# Patient Record
Sex: Female | Born: 1970 | Race: White | Hispanic: No | Smoking: Current every day smoker
Health system: Southern US, Community
[De-identification: ages and names within clinical notes are randomized; demographics above are authoritative.]

## PROBLEM LIST (undated history)

## (undated) DIAGNOSIS — F1010 Alcohol abuse, uncomplicated: Secondary | ICD-10-CM

## (undated) DIAGNOSIS — I639 Cerebral infarction, unspecified: Secondary | ICD-10-CM

## (undated) DIAGNOSIS — R569 Unspecified convulsions: Secondary | ICD-10-CM

## (undated) DIAGNOSIS — I10 Essential (primary) hypertension: Secondary | ICD-10-CM

## (undated) DIAGNOSIS — F419 Anxiety disorder, unspecified: Secondary | ICD-10-CM

## (undated) DIAGNOSIS — F32A Depression, unspecified: Secondary | ICD-10-CM

## (undated) DIAGNOSIS — K769 Liver disease, unspecified: Secondary | ICD-10-CM

## (undated) DIAGNOSIS — S42202A Unspecified fracture of upper end of left humerus, initial encounter for closed fracture: Secondary | ICD-10-CM

## (undated) DIAGNOSIS — F329 Major depressive disorder, single episode, unspecified: Secondary | ICD-10-CM

## (undated) HISTORY — PX: TONSILLECTOMY: SUR1361

## (undated) HISTORY — DX: Depression, unspecified: F32.A

## (undated) HISTORY — PX: COSMETIC SURGERY: SHX468

## (undated) HISTORY — PX: BREAST SURGERY: SHX581

## (undated) HISTORY — DX: Liver disease, unspecified: K76.9

## (undated) HISTORY — DX: Anxiety disorder, unspecified: F41.9

## (undated) HISTORY — DX: Unspecified convulsions: R56.9

## (undated) HISTORY — DX: Major depressive disorder, single episode, unspecified: F32.9

## (undated) HISTORY — PX: ABDOMINAL HYSTERECTOMY: SHX81

## (undated) HISTORY — DX: Cerebral infarction, unspecified: I63.9

---

## 1997-08-30 ENCOUNTER — Other Ambulatory Visit: Admission: RE | Admit: 1997-08-30 | Discharge: 1997-08-30 | Payer: Self-pay | Admitting: Obstetrics and Gynecology

## 1997-11-19 ENCOUNTER — Ambulatory Visit (HOSPITAL_COMMUNITY): Admission: RE | Admit: 1997-11-19 | Discharge: 1997-11-19 | Payer: Self-pay | Admitting: Obstetrics and Gynecology

## 1998-03-10 ENCOUNTER — Inpatient Hospital Stay (HOSPITAL_COMMUNITY): Admission: RE | Admit: 1998-03-10 | Discharge: 1998-03-11 | Payer: Self-pay | Admitting: Obstetrics and Gynecology

## 2000-03-31 ENCOUNTER — Other Ambulatory Visit: Admission: RE | Admit: 2000-03-31 | Discharge: 2000-03-31 | Payer: Self-pay | Admitting: Obstetrics and Gynecology

## 2000-06-13 ENCOUNTER — Ambulatory Visit (HOSPITAL_COMMUNITY): Admission: RE | Admit: 2000-06-13 | Discharge: 2000-06-13 | Payer: Self-pay | Admitting: Obstetrics and Gynecology

## 2001-04-20 ENCOUNTER — Other Ambulatory Visit: Admission: RE | Admit: 2001-04-20 | Discharge: 2001-04-20 | Payer: Self-pay | Admitting: Obstetrics and Gynecology

## 2002-12-28 ENCOUNTER — Other Ambulatory Visit: Admission: RE | Admit: 2002-12-28 | Discharge: 2002-12-28 | Payer: Self-pay | Admitting: Obstetrics and Gynecology

## 2005-02-22 ENCOUNTER — Other Ambulatory Visit: Payer: Self-pay

## 2005-02-22 ENCOUNTER — Observation Stay: Payer: Self-pay

## 2010-04-10 ENCOUNTER — Ambulatory Visit: Payer: Self-pay | Admitting: Family Medicine

## 2010-07-14 ENCOUNTER — Inpatient Hospital Stay (HOSPITAL_COMMUNITY)
Admission: RE | Admit: 2010-07-14 | Discharge: 2010-07-18 | DRG: 897 | Disposition: A | Discharge status: Home / Self Care | Payer: Managed Care, Other (non HMO) | Source: Ambulatory Visit | Attending: Psychiatry | Admitting: Psychiatry

## 2010-07-14 ENCOUNTER — Emergency Department (HOSPITAL_COMMUNITY)
Admission: EM | Admit: 2010-07-14 | Discharge: 2010-07-14 | Disposition: A | Discharge status: Other Acute Inpatient Hospital | Payer: Managed Care, Other (non HMO) | Attending: Emergency Medicine | Admitting: Emergency Medicine

## 2010-07-14 DIAGNOSIS — I10 Essential (primary) hypertension: Secondary | ICD-10-CM | POA: Insufficient documentation

## 2010-07-14 DIAGNOSIS — F3289 Other specified depressive episodes: Secondary | ICD-10-CM | POA: Insufficient documentation

## 2010-07-14 DIAGNOSIS — F1020 Alcohol dependence, uncomplicated: Principal | ICD-10-CM

## 2010-07-14 DIAGNOSIS — F1010 Alcohol abuse, uncomplicated: Secondary | ICD-10-CM | POA: Insufficient documentation

## 2010-07-14 DIAGNOSIS — F329 Major depressive disorder, single episode, unspecified: Secondary | ICD-10-CM | POA: Insufficient documentation

## 2010-07-14 DIAGNOSIS — Z6379 Other stressful life events affecting family and household: Secondary | ICD-10-CM

## 2010-07-14 LAB — COMPREHENSIVE METABOLIC PANEL
Alkaline Phosphatase: 149 U/L — ABNORMAL HIGH (ref 39–117)
BUN: 10 mg/dL (ref 6–23)
Chloride: 97 mEq/L (ref 96–112)
Glucose, Bld: 160 mg/dL — ABNORMAL HIGH (ref 70–99)
Potassium: 3.1 mEq/L — ABNORMAL LOW (ref 3.5–5.1)
Total Bilirubin: 1 mg/dL (ref 0.3–1.2)

## 2010-07-14 LAB — URINALYSIS, ROUTINE W REFLEX MICROSCOPIC
Protein, ur: NEGATIVE mg/dL
Urobilinogen, UA: 0.2 mg/dL (ref 0.0–1.0)

## 2010-07-14 LAB — DIFFERENTIAL
Basophils Relative: 0 % (ref 0–1)
Lymphocytes Relative: 25 % (ref 12–46)
Monocytes Absolute: 0.6 10*3/uL (ref 0.1–1.0)
Monocytes Relative: 12 % (ref 3–12)
Neutro Abs: 3.1 10*3/uL (ref 1.7–7.7)

## 2010-07-14 LAB — CBC
HCT: 38.7 % (ref 36.0–46.0)
Hemoglobin: 12.9 g/dL (ref 12.0–15.0)
MCH: 35.2 pg — ABNORMAL HIGH (ref 26.0–34.0)
MCHC: 33.3 g/dL (ref 30.0–36.0)

## 2010-07-14 LAB — RAPID URINE DRUG SCREEN, HOSP PERFORMED
Amphetamines: NOT DETECTED
Barbiturates: NOT DETECTED

## 2010-07-15 DIAGNOSIS — F1020 Alcohol dependence, uncomplicated: Secondary | ICD-10-CM

## 2010-07-15 LAB — COMPREHENSIVE METABOLIC PANEL
ALT: 44 U/L — ABNORMAL HIGH (ref 0–35)
AST: 104 U/L — ABNORMAL HIGH (ref 0–37)
Albumin: 3.2 g/dL — ABNORMAL LOW (ref 3.5–5.2)
CO2: 28 mEq/L (ref 19–32)
Calcium: 9.6 mg/dL (ref 8.4–10.5)
Chloride: 96 mEq/L (ref 96–112)
GFR calc Af Amer: >60 mL/min (ref >60)
GFR calc non Af Amer: >60 mL/min (ref >60)
Sodium: 134 mEq/L — ABNORMAL LOW (ref 135–145)

## 2010-07-18 NOTE — H&P (Signed)
Laura Heath              ACCOUNT NO.:  [id number]  MEDICAL RECORD NO.:  [id number]           PATIENT TYPE:  I  LOCATION:  0301                          FACILITY:  [facility]  PHYSICIAN:  Anselm Jungling, MD  DATE OF BIRTH:  01/14/1971  DATE OF ADMISSION:  07/14/2010 DATE OF DISCHARGE:                      PSYCHIATRIC ADMISSION ASSESSMENT   IDENTIFICATION:  A 40 year old female.  This is a voluntary admission.  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  She initially presented with an alcohol level of 297 mg/dL.  She reports drinking alcohol for the past 8 years with a gradual escalation in pattern.  It started out with a few shots, and through the years as she has increased it to a point now where for at least a year she has to drink shots all day long in order to keep from shaking.  Within the past week, she experienced a blackout at work and also fell at home and hit her head.  This week her husband left and took their 61 year old daughter with him.  Laura Heath would like to achieve sobriety and plans on pursuing group support and outpatient counseling to maintain sobriety.  She has no history of previous treatment or detox.  Denies suicidal thoughts.  PAST PSYCHIATRIC HISTORY:  Drinking alcohol for the past 8 years.  No previous history of psychotropics or alcohol or other substance treatment.  She denies a history of other substance abuse.  Currently she wakes up in the morning, tremulous, and has to drink to calm down. Has to drink shots periodically throughout the day and has hidden liquor at work.  She has to drink heavily at home at night and drinks to go to sleep at night.  She estimates that she is drinking at least a fifth of whiskey every day.  SOCIAL HISTORY:  Married Caucasian female with a 84 year old daughter. Denies a history of legal  problems.  Currently, having marital difficulties and parenting issues related to the alcohol.  FAMILY HISTORY:  Positive for grandfather with alcohol abuse.  MEDICAL HISTORY: 1. Primary care provider is [facility]. 2. Hospitalized times 1 for hypomagnesemia with blackout and     hypokalemia.  MEDICAL PROBLEMS:  Hypertension.  MEDICATIONS: 1. Benazepril 10 mg daily. 2. Atenolol 50 mg daily.  Transcriptionist under past medical history.  No chief.  DRUG ALLERGIES:  Percocet.  PHYSICAL EXAMINATION:  GENERAL:  Done in the emergency room.  This is a petite Caucasian female, tremulous but fully alert. ADMITTING VITAL SIGNS:  Temperature 98.2, pulse 64, respirations 18, blood pressure 128/80.  Pulse oximetry 100%.  CBC:  WBC 5.0, hemoglobin 12.9, hematocrit 38.7, platelets 99,000, MCV 105.7.  Electrolytes are remarkable for potassium of 3.1, BUN 10, creatinine 0.81.  Liver enzymes are elevated with an SGOT of 111, SGPT 49, alkaline phosphatase 149, total bilirubin 1.0.  Urine drug screen negative for all substances. Routine urinalysis reveals ketones 40.  MENTAL STATUS EXAM:  Fully alert female, good eye contact, tremulous and anxious in appearance.  No signs of delirium or  confusion, cooperative, wanting detox from the alcohol, very motivated to get her daughter back and keep her marriage intact.  Wants to pursue outpatient treatment. Candid about her alcohol use.  Cognitively intact.  Mild motor ataxia is noted, but her gait is fairly stable.  No nystagmus is noted.  No dangerous thoughts.  No suicidal thinking, concentration and judgment are intact.  DISCHARGE DIAGNOSES:  AXIS I:  Alcohol dependence. AXIS II:  Deferred. AXIS III:  Hypertension by history. AXIS IV:  Severe marital and parenting issues. AXIS V:  Current is 40, past year not known.  PLAN:  Plan is to voluntarily admit her with a goal of a safe detox in 5 days.  We are going to restart her home  medications and started her on a Librium detox protocol and explained the medications to her.  We are going to recheck a C-met, B12 level and will give her magnesium 200 mg b.i.d. x3 days.  Recheck a magnesium level tomorrow also and will replete her potassium with 40 mEq of potassium today.     Margaret A. Lorin Picket, N.P.   ______________________________ Anselm Jungling, MD    MAS/MEDQ  D:  07/15/2010  T:  07/15/2010  Job:  [id number]  Electronically Signed by Kari Baars N.P. on 07/16/2010 04:34:25 PM Electronically Signed by Geralyn Flash MD on 07/18/2010 11:29:58 AM

## 2010-07-25 NOTE — Discharge Summary (Signed)
  Laura Heath              ACCOUNT NO.:  [id number]  MEDICAL RECORD NO.:  [id number]           PATIENT TYPE:  I  LOCATION:  0302                          FACILITY:  [facility]  PHYSICIAN:  Anselm Jungling, MD  DATE OF BIRTH:  Jan 18, 1971  DATE OF ADMISSION:  07/14/2010 DATE OF DISCHARGE:  07/18/2010                              DISCHARGE SUMMARY   IDENTIFYING DATA/REASON FOR ADMISSION:  This was an inpatient psychiatric admission for Laura Heath, a 40 year old female who presented for treatment of alcohol dependence.  Please refer to the admission note for further details pertaining to the symptoms, circumstances and history that led to her hospitalization.  She was given an initial Axis I diagnosis of alcohol dependence.  MEDICAL AND LABORATORY:  The patient was medically and physically assessed by the psychiatric nurse practitioner.  She was in generally good health but was continued on her usual regimen of atenolol and benazepril.  There were no significant medical issues, otherwise, during her stay.  HOSPITAL COURSE:  The patient was admitted to the adult inpatient psychiatric service.  She presented as a well-nourished, normally- developed adult female who was pleasant and cooperative but sad.  Her thoughts and speech were normally organized.  She verbalized a strong desire for help.  She had presented to the emergency service with a blood alcohol level of 0.297.  She had been drinking heavily for 8 years.  This was her first ever inpatient admission and first ever course of detoxification for alcohol abuse.  Her detoxification was uneventful.  She participated in therapeutic groups and activities including those geared towards 12-step recovery. On the 5th hospital day, she agreed to the following aftercare plan.  AFTERCARE:  The patient was to follow up with Isla Pence, LCSW, to be arranged at the time of this dictation.  DISCHARGE MEDICATIONS: 1. Atenolol, 1  tablet daily. 2. Benazepril, 1 tablet daily.  DISCHARGE DIAGNOSES:  Axis I: Alcohol dependence, early remission. Axis II: Deferred. Axis III: Hypertension. Axis IV: Stressors severe. Axis V:  Global assessment of functioning on discharge 50.  The suicide risk assessment was completed at the time of discharge and there was felt to be minimal risk based upon it.     Anselm Jungling, MD     SPB/MEDQ  D:  07/24/2010  T:  07/24/2010  Job:  [id number]  Electronically Signed by Geralyn Flash MD on 07/25/2010 09:59:31 AM

## 2012-07-12 ENCOUNTER — Emergency Department: Payer: Self-pay | Admitting: Emergency Medicine

## 2012-07-27 ENCOUNTER — Emergency Department: Payer: Self-pay | Admitting: Emergency Medicine

## 2012-11-19 ENCOUNTER — Emergency Department: Payer: Self-pay

## 2012-11-19 LAB — COMPREHENSIVE METABOLIC PANEL
Anion Gap: 10 (ref 7–16)
BUN: 4 mg/dL — ABNORMAL LOW (ref 7–18)
Bilirubin,Total: 4.3 mg/dL — ABNORMAL HIGH (ref 0.2–1.0)
Calcium, Total: 9.1 mg/dL (ref 8.5–10.1)
Co2: 28 mmol/L (ref 21–32)
Creatinine: 0.72 mg/dL (ref 0.60–1.30)
EGFR (Non-African Amer.): >60
Glucose: 104 mg/dL — ABNORMAL HIGH (ref 65–99)
Osmolality: 267 (ref 275–301)
SGOT(AST): 250 U/L — ABNORMAL HIGH (ref 15–37)
SGPT (ALT): 73 U/L (ref 12–78)

## 2012-11-19 LAB — URINALYSIS, COMPLETE
Blood: NEGATIVE
Ketone: NEGATIVE
Ph: 8 (ref 4.5–8.0)
RBC,UR: 5 /HPF (ref 0–5)
Specific Gravity: 1.017 (ref 1.003–1.030)
Squamous Epithelial: 1

## 2012-11-19 LAB — DRUG SCREEN, URINE
Barbiturates, Ur Screen: NEGATIVE (ref ?–200)
Cocaine Metabolite,Ur ~~LOC~~: NEGATIVE (ref ?–300)
MDMA (Ecstasy)Ur Screen: NEGATIVE (ref ?–500)
Methadone, Ur Screen: NEGATIVE (ref ?–300)
Phencyclidine (PCP) Ur S: NEGATIVE (ref ?–25)
Tricyclic, Ur Screen: NEGATIVE (ref ?–1000)

## 2012-11-19 LAB — CBC
HGB: 12.7 g/dL (ref 12.0–16.0)
MCHC: 34 g/dL (ref 32.0–36.0)
Platelet: 156 10*3/uL (ref 150–440)
RBC: 3.11 10*6/uL — ABNORMAL LOW (ref 3.80–5.20)
RDW: 15.9 % — ABNORMAL HIGH (ref 11.5–14.5)

## 2012-11-19 LAB — TSH: Thyroid Stimulating Horm: 1.01 u[IU]/mL

## 2012-11-19 LAB — ETHANOL: Ethanol: <3 mg/dL

## 2013-10-23 ENCOUNTER — Emergency Department: Payer: Self-pay | Admitting: Emergency Medicine

## 2013-10-23 LAB — ETHANOL
ETHANOL %: 0.28 % — AB (ref 0.000–0.080)
Ethanol: 280 mg/dL

## 2014-08-19 ENCOUNTER — Other Ambulatory Visit: Payer: Self-pay | Admitting: Physician Assistant

## 2014-08-19 DIAGNOSIS — Z139 Encounter for screening, unspecified: Secondary | ICD-10-CM

## 2014-09-07 ENCOUNTER — Ambulatory Visit: Payer: Managed Care, Other (non HMO) | Admitting: *Deleted

## 2014-09-28 DIAGNOSIS — S42202A Unspecified fracture of upper end of left humerus, initial encounter for closed fracture: Secondary | ICD-10-CM

## 2014-09-28 HISTORY — DX: Unspecified fracture of upper end of left humerus, initial encounter for closed fracture: S42.202A

## 2014-10-01 ENCOUNTER — Emergency Department: Payer: Managed Care, Other (non HMO)

## 2014-10-01 ENCOUNTER — Emergency Department: Payer: Self-pay

## 2014-10-01 ENCOUNTER — Other Ambulatory Visit: Payer: Self-pay

## 2014-10-01 ENCOUNTER — Emergency Department
Admission: EM | Admit: 2014-10-01 | Discharge: 2014-10-01 | Disposition: A | Discharge status: Other Acute Inpatient Hospital | Payer: Self-pay | Attending: Emergency Medicine | Admitting: Emergency Medicine

## 2014-10-01 DIAGNOSIS — Z720 Tobacco use: Secondary | ICD-10-CM | POA: Insufficient documentation

## 2014-10-01 DIAGNOSIS — W1839XA Other fall on same level, initial encounter: Secondary | ICD-10-CM | POA: Insufficient documentation

## 2014-10-01 DIAGNOSIS — S42202D Unspecified fracture of upper end of left humerus, subsequent encounter for fracture with routine healing: Secondary | ICD-10-CM

## 2014-10-01 DIAGNOSIS — S40012A Contusion of left shoulder, initial encounter: Secondary | ICD-10-CM | POA: Insufficient documentation

## 2014-10-01 DIAGNOSIS — F10931 Alcohol use, unspecified with withdrawal delirium: Secondary | ICD-10-CM

## 2014-10-01 DIAGNOSIS — Y9289 Other specified places as the place of occurrence of the external cause: Secondary | ICD-10-CM | POA: Insufficient documentation

## 2014-10-01 DIAGNOSIS — S065X9A Traumatic subdural hemorrhage with loss of consciousness of unspecified duration, initial encounter: Secondary | ICD-10-CM

## 2014-10-01 DIAGNOSIS — S42292D Other displaced fracture of upper end of left humerus, subsequent encounter for fracture with routine healing: Secondary | ICD-10-CM | POA: Insufficient documentation

## 2014-10-01 DIAGNOSIS — I10 Essential (primary) hypertension: Secondary | ICD-10-CM | POA: Insufficient documentation

## 2014-10-01 DIAGNOSIS — S065XAA Traumatic subdural hemorrhage with loss of consciousness status unknown, initial encounter: Secondary | ICD-10-CM

## 2014-10-01 DIAGNOSIS — F10231 Alcohol dependence with withdrawal delirium: Secondary | ICD-10-CM | POA: Insufficient documentation

## 2014-10-01 DIAGNOSIS — F911 Conduct disorder, childhood-onset type: Secondary | ICD-10-CM | POA: Insufficient documentation

## 2014-10-01 DIAGNOSIS — Y998 Other external cause status: Secondary | ICD-10-CM | POA: Insufficient documentation

## 2014-10-01 DIAGNOSIS — I9589 Other hypotension: Secondary | ICD-10-CM | POA: Insufficient documentation

## 2014-10-01 DIAGNOSIS — S065X0A Traumatic subdural hemorrhage without loss of consciousness, initial encounter: Secondary | ICD-10-CM | POA: Insufficient documentation

## 2014-10-01 DIAGNOSIS — Y9389 Activity, other specified: Secondary | ICD-10-CM | POA: Insufficient documentation

## 2014-10-01 HISTORY — DX: Essential (primary) hypertension: I10

## 2014-10-01 HISTORY — DX: Alcohol abuse, uncomplicated: F10.10

## 2014-10-01 HISTORY — DX: Unspecified fracture of upper end of left humerus, initial encounter for closed fracture: S42.202A

## 2014-10-01 LAB — COMPREHENSIVE METABOLIC PANEL
ALK PHOS: 219 U/L — AB (ref 38–126)
ALT: 22 U/L (ref 14–54)
AST: 83 U/L — AB (ref 15–41)
Albumin: 2.5 g/dL — ABNORMAL LOW (ref 3.5–5.0)
Anion gap: 11 (ref 5–15)
BUN: 15 mg/dL (ref 6–20)
CHLORIDE: 99 mmol/L — AB (ref 101–111)
CO2: 24 mmol/L (ref 22–32)
Calcium: 7.7 mg/dL — ABNORMAL LOW (ref 8.9–10.3)
Creatinine, Ser: 0.76 mg/dL (ref 0.44–1.00)
GFR calc non Af Amer: >60 mL/min (ref >60)
Glucose, Bld: 103 mg/dL — ABNORMAL HIGH (ref 65–99)
POTASSIUM: 3.2 mmol/L — AB (ref 3.5–5.1)
Sodium: 134 mmol/L — ABNORMAL LOW (ref 135–145)
Total Bilirubin: 3.3 mg/dL — ABNORMAL HIGH (ref 0.3–1.2)
Total Protein: 6.4 g/dL — ABNORMAL LOW (ref 6.5–8.1)

## 2014-10-01 LAB — CBC WITH DIFFERENTIAL/PLATELET
Basophils Absolute: 0 10*3/uL (ref 0–0.1)
Basophils Relative: 0 %
Eosinophils Absolute: 0 10*3/uL (ref 0–0.7)
Eosinophils Relative: 0 %
HCT: 23.7 % — ABNORMAL LOW (ref 35.0–47.0)
HEMOGLOBIN: 8 g/dL — AB (ref 12.0–16.0)
LYMPHS ABS: 0.6 10*3/uL — AB (ref 1.0–3.6)
Lymphocytes Relative: 6 %
MCH: 37.1 pg — AB (ref 26.0–34.0)
MCHC: 33.7 g/dL (ref 32.0–36.0)
MCV: 109.9 fL — ABNORMAL HIGH (ref 80.0–100.0)
MONO ABS: 1.3 10*3/uL — AB (ref 0.2–0.9)
Monocytes Relative: 14 %
NEUTROS ABS: 7.1 10*3/uL — AB (ref 1.4–6.5)
Platelets: 63 10*3/uL — ABNORMAL LOW (ref 150–440)
RBC: 2.16 MIL/uL — ABNORMAL LOW (ref 3.80–5.20)
RDW: 14 % (ref 11.5–14.5)
WBC: 9 10*3/uL (ref 3.6–11.0)

## 2014-10-01 LAB — URINE DRUG SCREEN, QUALITATIVE (ARMC ONLY)
Amphetamines, Ur Screen: NOT DETECTED
BENZODIAZEPINE, UR SCRN: NOT DETECTED
Barbiturates, Ur Screen: NOT DETECTED
CANNABINOID 50 NG, UR ~~LOC~~: NOT DETECTED
Cocaine Metabolite,Ur ~~LOC~~: NOT DETECTED
MDMA (ECSTASY) UR SCREEN: NOT DETECTED
METHADONE SCREEN, URINE: NOT DETECTED
Opiate, Ur Screen: NOT DETECTED
Phencyclidine (PCP) Ur S: NOT DETECTED
Tricyclic, Ur Screen: POSITIVE — AB

## 2014-10-01 LAB — URINALYSIS COMPLETE WITH MICROSCOPIC (ARMC ONLY)
BILIRUBIN URINE: NEGATIVE
Glucose, UA: NEGATIVE mg/dL
HGB URINE DIPSTICK: NEGATIVE
Nitrite: POSITIVE — AB
Protein, ur: NEGATIVE mg/dL
Specific Gravity, Urine: 1.014 (ref 1.005–1.030)
pH: 6 (ref 5.0–8.0)

## 2014-10-01 LAB — OSMOLALITY: Osmolality: 278 mOsm/kg (ref 275–295)

## 2014-10-01 LAB — MAGNESIUM: Magnesium: 1.6 mg/dL — ABNORMAL LOW (ref 1.7–2.4)

## 2014-10-01 LAB — ETHANOL

## 2014-10-01 LAB — LIPASE, BLOOD: Lipase: 28 U/L (ref 22–51)

## 2014-10-01 MED ORDER — THIAMINE HCL 100 MG/ML IJ SOLN
400.0000 mg | INTRAMUSCULAR | Status: AC
  Administered 2014-10-01: 400 mg via INTRAVENOUS
  Filled 2014-10-01: qty 4

## 2014-10-01 MED ORDER — LORAZEPAM 2 MG PO TABS: 0.0000 mg | ORAL_TABLET | Freq: Four times a day (QID) | ORAL | Status: DC

## 2014-10-01 MED ORDER — LORAZEPAM 2 MG/ML IJ SOLN: 0.0000 mg | Freq: Two times a day (BID) | INTRAMUSCULAR | Status: DC

## 2014-10-01 MED ORDER — THIAMINE HCL 100 MG/ML IJ SOLN
INTRAMUSCULAR | Status: AC
  Filled 2014-10-01: qty 4

## 2014-10-01 MED ORDER — THIAMINE HCL 100 MG/ML IJ SOLN
100.0000 mg | Freq: Every day | INTRAMUSCULAR | Status: DC
  Administered 2014-10-01: 100 mg via INTRAVENOUS

## 2014-10-01 MED ORDER — LORAZEPAM 2 MG/ML IJ SOLN
INTRAMUSCULAR | Status: AC
  Administered 2014-10-01: 2 mg via INTRAVENOUS
  Filled 2014-10-01: qty 1

## 2014-10-01 MED ORDER — LORAZEPAM 2 MG PO TABS: 0.0000 mg | ORAL_TABLET | Freq: Two times a day (BID) | ORAL | Status: DC

## 2014-10-01 MED ORDER — THIAMINE HCL 100 MG/ML IJ SOLN
INTRAVENOUS | Status: AC
  Administered 2014-10-01: 17:00:00 via INTRAVENOUS
  Filled 2014-10-01: qty 1000

## 2014-10-01 MED ORDER — LORAZEPAM 2 MG/ML IJ SOLN
0.0000 mg | Freq: Four times a day (QID) | INTRAMUSCULAR | Status: DC
  Administered 2014-10-01: 2 mg via INTRAVENOUS

## 2014-10-01 MED ORDER — THIAMINE HCL 100 MG/ML IJ SOLN
INTRAMUSCULAR | Status: AC
  Administered 2014-10-01: 100 mg via INTRAVENOUS
  Filled 2014-10-01: qty 2

## 2014-10-01 MED ORDER — VITAMIN B-1 100 MG PO TABS: 100.0000 mg | ORAL_TABLET | Freq: Every day | ORAL | Status: DC
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
## 2014-10-01 NOTE — ED Notes (Signed)
Pt reports to ED b/c of fall. Pt sts she fell last night and several times this morning.  Pt denies LOC, n/v and SOB. Pt sts she hit back of head. Pt sts she has taken ibuprofen and tramadol.

## 2014-10-02 DIAGNOSIS — S065X9A Traumatic subdural hemorrhage with loss of consciousness of unspecified duration, initial encounter: Secondary | ICD-10-CM | POA: Insufficient documentation

## 2014-10-02 DIAGNOSIS — S065XAA Traumatic subdural hemorrhage with loss of consciousness status unknown, initial encounter: Secondary | ICD-10-CM | POA: Insufficient documentation

## 2014-10-03 DIAGNOSIS — D696 Thrombocytopenia, unspecified: Secondary | ICD-10-CM | POA: Insufficient documentation

## 2014-10-03 DIAGNOSIS — K7030 Alcoholic cirrhosis of liver without ascites: Secondary | ICD-10-CM | POA: Insufficient documentation

## 2014-10-03 DIAGNOSIS — S42209A Unspecified fracture of upper end of unspecified humerus, initial encounter for closed fracture: Secondary | ICD-10-CM | POA: Insufficient documentation

## 2014-10-03 DIAGNOSIS — D539 Nutritional anemia, unspecified: Secondary | ICD-10-CM | POA: Insufficient documentation

## 2015-01-25 ENCOUNTER — Ambulatory Visit: Payer: Self-pay | Admitting: Internal Medicine

## 2015-02-01 ENCOUNTER — Ambulatory Visit: Payer: Self-pay | Admitting: Internal Medicine

## 2015-02-01 LAB — LIPID PANEL
Cholesterol: 304 mg/dL — AB (ref 0–200)
HDL: 114 mg/dL — AB (ref 35–70)
LDL CALC: 170 mg/dL
TRIGLYCERIDES: 102 mg/dL (ref 40–160)

## 2015-02-01 LAB — TSH: TSH: 0.49 u[IU]/mL (ref 0.41–5.90)

## 2015-02-01 LAB — HEPATIC FUNCTION PANEL: BILIRUBIN, TOTAL: 1.6 mg/dL

## 2015-02-08 ENCOUNTER — Ambulatory Visit: Payer: Self-pay | Admitting: Internal Medicine

## 2015-02-15 ENCOUNTER — Ambulatory Visit: Payer: Self-pay | Admitting: Internal Medicine

## 2015-02-15 ENCOUNTER — Ambulatory Visit: Payer: Self-pay | Admitting: Ophthalmology

## 2015-03-15 ENCOUNTER — Other Ambulatory Visit: Payer: Self-pay

## 2015-03-22 ENCOUNTER — Ambulatory Visit: Payer: Self-pay | Admitting: Internal Medicine

## 2015-03-29 ENCOUNTER — Ambulatory Visit: Payer: Self-pay | Admitting: Internal Medicine

## 2015-05-10 ENCOUNTER — Other Ambulatory Visit: Payer: Self-pay

## 2015-05-11 ENCOUNTER — Ambulatory Visit: Payer: Self-pay

## 2015-05-16 ENCOUNTER — Encounter: Payer: Self-pay | Admitting: *Deleted

## 2015-05-16 ENCOUNTER — Emergency Department: Payer: Self-pay

## 2015-05-16 ENCOUNTER — Emergency Department: Payer: Managed Care, Other (non HMO)

## 2015-05-16 ENCOUNTER — Emergency Department
Admission: EM | Admit: 2015-05-16 | Discharge: 2015-05-17 | Disposition: A | Discharge status: Other Acute Inpatient Hospital | Payer: Self-pay | Attending: Emergency Medicine | Admitting: Emergency Medicine

## 2015-05-16 DIAGNOSIS — F1010 Alcohol abuse, uncomplicated: Secondary | ICD-10-CM

## 2015-05-16 DIAGNOSIS — I10 Essential (primary) hypertension: Secondary | ICD-10-CM | POA: Insufficient documentation

## 2015-05-16 DIAGNOSIS — S5012XA Contusion of left forearm, initial encounter: Secondary | ICD-10-CM | POA: Insufficient documentation

## 2015-05-16 DIAGNOSIS — F10239 Alcohol dependence with withdrawal, unspecified: Secondary | ICD-10-CM

## 2015-05-16 DIAGNOSIS — S60222A Contusion of left hand, initial encounter: Secondary | ICD-10-CM | POA: Insufficient documentation

## 2015-05-16 DIAGNOSIS — F10939 Alcohol use, unspecified with withdrawal, unspecified: Secondary | ICD-10-CM

## 2015-05-16 DIAGNOSIS — R296 Repeated falls: Secondary | ICD-10-CM | POA: Insufficient documentation

## 2015-05-16 DIAGNOSIS — R4689 Other symptoms and signs involving appearance and behavior: Secondary | ICD-10-CM

## 2015-05-16 DIAGNOSIS — F17210 Nicotine dependence, cigarettes, uncomplicated: Secondary | ICD-10-CM | POA: Insufficient documentation

## 2015-05-16 DIAGNOSIS — F911 Conduct disorder, childhood-onset type: Secondary | ICD-10-CM | POA: Insufficient documentation

## 2015-05-16 DIAGNOSIS — Y998 Other external cause status: Secondary | ICD-10-CM | POA: Insufficient documentation

## 2015-05-16 DIAGNOSIS — F1310 Sedative, hypnotic or anxiolytic abuse, uncomplicated: Secondary | ICD-10-CM | POA: Insufficient documentation

## 2015-05-16 DIAGNOSIS — Y9389 Activity, other specified: Secondary | ICD-10-CM | POA: Insufficient documentation

## 2015-05-16 DIAGNOSIS — Y9289 Other specified places as the place of occurrence of the external cause: Secondary | ICD-10-CM | POA: Insufficient documentation

## 2015-05-16 DIAGNOSIS — F1994 Other psychoactive substance use, unspecified with psychoactive substance-induced mood disorder: Secondary | ICD-10-CM

## 2015-05-16 DIAGNOSIS — Z23 Encounter for immunization: Secondary | ICD-10-CM | POA: Insufficient documentation

## 2015-05-16 HISTORY — DX: Cerebral infarction, unspecified: I63.9

## 2015-05-16 LAB — CBC WITH DIFFERENTIAL/PLATELET
BASOS ABS: 0.1 10*3/uL (ref 0–0.1)
BASOS PCT: 1 %
Eosinophils Absolute: 0.2 10*3/uL (ref 0–0.7)
Eosinophils Relative: 2 %
HCT: 45.9 % (ref 35.0–47.0)
HEMOGLOBIN: 15.3 g/dL (ref 12.0–16.0)
LYMPHS ABS: 2.1 10*3/uL (ref 1.0–3.6)
Lymphocytes Relative: 25 %
MCH: 36.4 pg — ABNORMAL HIGH (ref 26.0–34.0)
MCHC: 33.3 g/dL (ref 32.0–36.0)
MCV: 109.2 fL — ABNORMAL HIGH (ref 80.0–100.0)
Monocytes Absolute: 0.6 10*3/uL (ref 0.2–0.9)
Monocytes Relative: 7 %
Neutro Abs: 5.4 10*3/uL (ref 1.4–6.5)
Neutrophils Relative %: 65 %
Platelets: 194 10*3/uL (ref 150–440)
RBC: 4.21 MIL/uL (ref 3.80–5.20)
RDW: 14.5 % (ref 11.5–14.5)
WBC: 8.3 10*3/uL (ref 3.6–11.0)

## 2015-05-16 LAB — URINE DRUG SCREEN, QUALITATIVE (ARMC ONLY)
Amphetamines, Ur Screen: NOT DETECTED
BARBITURATES, UR SCREEN: NOT DETECTED
BENZODIAZEPINE, UR SCRN: NOT DETECTED
CANNABINOID 50 NG, UR ~~LOC~~: NOT DETECTED
Cocaine Metabolite,Ur ~~LOC~~: NOT DETECTED
MDMA (Ecstasy)Ur Screen: NOT DETECTED
Methadone Scn, Ur: NOT DETECTED
Opiate, Ur Screen: NOT DETECTED
PHENCYCLIDINE (PCP) UR S: NOT DETECTED
Tricyclic, Ur Screen: POSITIVE — AB

## 2015-05-16 LAB — COMPREHENSIVE METABOLIC PANEL
ALT: 28 U/L (ref 14–54)
AST: 144 U/L — ABNORMAL HIGH (ref 15–41)
Albumin: 3.7 g/dL (ref 3.5–5.0)
Alkaline Phosphatase: 354 U/L — ABNORMAL HIGH (ref 38–126)
Anion gap: 16 — ABNORMAL HIGH (ref 5–15)
BILIRUBIN TOTAL: 2.2 mg/dL — AB (ref 0.3–1.2)
BUN: 6 mg/dL (ref 6–20)
CO2: 22 mmol/L (ref 22–32)
CREATININE: 0.54 mg/dL (ref 0.44–1.00)
Calcium: 9.6 mg/dL (ref 8.9–10.3)
Chloride: 102 mmol/L (ref 101–111)
GFR calc Af Amer: >60 mL/min (ref >60)
Glucose, Bld: 109 mg/dL — ABNORMAL HIGH (ref 65–99)
Potassium: 4.4 mmol/L (ref 3.5–5.1)
Sodium: 140 mmol/L (ref 135–145)
Total Protein: 9.2 g/dL — ABNORMAL HIGH (ref 6.5–8.1)

## 2015-05-16 LAB — SALICYLATE LEVEL

## 2015-05-16 LAB — ETHANOL: ALCOHOL ETHYL (B): 336 mg/dL — AB (ref <5)

## 2015-05-16 LAB — ACETAMINOPHEN LEVEL

## 2015-05-16 MED ORDER — LORAZEPAM 2 MG PO TABS: 0.0000 mg | ORAL_TABLET | Freq: Four times a day (QID) | ORAL | Status: DC

## 2015-05-16 MED ORDER — LORAZEPAM 2 MG PO TABS: 0.0000 mg | ORAL_TABLET | Freq: Two times a day (BID) | ORAL | Status: DC

## 2015-05-16 MED ORDER — TETANUS-DIPHTH-ACELL PERTUSSIS 5-2.5-18.5 LF-MCG/0.5 IM SUSP
0.5000 mL | Freq: Once | INTRAMUSCULAR | Status: AC
  Administered 2015-05-16: 0.5 mL via INTRAMUSCULAR
  Filled 2015-05-16: qty 0.5

## 2015-05-16 MED ORDER — FOLIC ACID 1 MG PO TABS: 1.0000 mg | ORAL_TABLET | Freq: Once | ORAL | Status: DC

## 2015-05-16 MED ORDER — VITAMIN B-1 100 MG PO TABS
100.0000 mg | ORAL_TABLET | Freq: Every day | ORAL | Status: DC
  Administered 2015-05-17: 100 mg via ORAL
  Filled 2015-05-16: qty 1

## 2015-05-16 NOTE — Progress Notes (Deleted)
Per Dr Clapacs pt is to be admitted to [facility] inpatient psychiatric unit.  05/16/2015 Nicole Kennedy Brines, MS, NCC, LPCA Therapeutic Triage Specialist 
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
## 2015-05-16 NOTE — ED Notes (Signed)
Report received from Nellie, RN. Pt. Alert and oriented in no distress denies SI, HI, AVH and pain.   Pt. Instructed to come to me with problems or concerns.Will continue to monitor for safety via security cameras and Q 15 minute checks.  

## 2015-05-16 NOTE — ED Notes (Signed)
Pt. To ED-BHU  from ED ambulatory without difficulty, to room #1 . Report from RN. Pt. Is alert and oriented, warm and dry in no distress. Pt. Denies SI, HI, and AVH. Pt. Calm and cooperative. Pt. Made aware of security cameras and Q15 minute rounds. Pt. Encouraged to let Nursing staff know of any concerns or needs.

## 2015-05-16 NOTE — ED Notes (Signed)
Pt. Noted in room lying in bed watching the tv., . No complaints or concerns voiced. No distress or abnormal behavior noted. Will continue to monitor with security cameras. Q 15 minute rounds continue. 
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
## 2015-05-17 ENCOUNTER — Ambulatory Visit: Payer: Self-pay | Admitting: Internal Medicine
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
## 2015-05-17 NOTE — ED Notes (Signed)
Pt. Noted in room resting quietly;. No complaints or concerns voiced. No distress or abnormal behavior noted. Will continue to monitor with security cameras. Q 15 minute rounds continue. 

## 2015-05-17 NOTE — ED Notes (Signed)
Patient re-assesed by Dr.Clapacs IVC rescinded patient to be D/C

## 2015-05-17 NOTE — ED Notes (Signed)
Lunch served

## 2015-05-17 NOTE — ED Notes (Signed)
Pt. Noted in room. No complaints or concerns voiced. No distress or abnormal behavior noted. Will continue to monitor with security cameras. Q 15 minute rounds continue. 

## 2015-05-17 NOTE — ED Notes (Signed)
Pt. Noted in room. No complaints or concerns voiced. No distress or abnormal behavior noted. Will continue to monitor with security cameras. Q 15 minute rounds continue.,visit went well with mom , mom just felt that pt should stay ?

## 2015-05-17 NOTE — ED Notes (Signed)
Pt mother in lobby wishing to talk to staff and pt wishes us not to, pt and mother informed they nay visit at visiting  time

## 2015-05-17 NOTE — ED Notes (Signed)
Dr Clapacs here 

## 2015-05-17 NOTE — ED Notes (Signed)
pts mother here to visit with staff in attendance

## 2015-05-17 NOTE — ED Notes (Signed)
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
## 2015-05-17 NOTE — ED Notes (Signed)
Pt. Noted in room. No complaints or concerns voiced. No distress or abnormal behavior noted. Will continue to monitor with security cameras. Q 15 minute rounds continue.,explain to pt the disch process ,she is agreeable with plan

## 2015-05-17 NOTE — ED Provider Notes (Signed)
-----------------------------------------   4:19 PM on 05/17/2015 -----------------------------------------  Case discussed with Dr. Delaney Meigslaypacs who has evaluated the patient and lifted the patient's involuntary commitment. He recommends discharge at this time with outpatient resources. Vital signs stable.  Gayla DossEryka A Nelta Caudill, MD 05/17/15 [phone]

## 2015-05-17 NOTE — ED Notes (Signed)
Pt dc to home with mother.

## 2015-05-24 ENCOUNTER — Ambulatory Visit: Payer: Self-pay | Admitting: Internal Medicine

## 2015-05-31 ENCOUNTER — Ambulatory Visit: Payer: Self-pay | Admitting: Internal Medicine

## 2015-06-21 ENCOUNTER — Other Ambulatory Visit: Payer: Self-pay

## 2015-06-21 LAB — BASIC METABOLIC PANEL
Creatinine: 0.6 mg/dL (ref 0.5–1.1)
Glucose: 102 mg/dL

## 2015-06-28 ENCOUNTER — Ambulatory Visit: Payer: Self-pay | Admitting: Internal Medicine

## 2015-09-26 ENCOUNTER — Other Ambulatory Visit: Payer: Self-pay

## 2015-09-26 DIAGNOSIS — I10 Essential (primary) hypertension: Secondary | ICD-10-CM

## 2015-09-26 DIAGNOSIS — F10239 Alcohol dependence with withdrawal, unspecified: Secondary | ICD-10-CM

## 2015-09-26 DIAGNOSIS — F1010 Alcohol abuse, uncomplicated: Secondary | ICD-10-CM

## 2015-09-26 DIAGNOSIS — F10939 Alcohol use, unspecified with withdrawal, unspecified: Secondary | ICD-10-CM

## 2015-09-26 DIAGNOSIS — Z139 Encounter for screening, unspecified: Secondary | ICD-10-CM

## 2015-09-27 LAB — COMPREHENSIVE METABOLIC PANEL
ALBUMIN: 4.1 g/dL (ref 3.5–5.5)
ALT: 17 IU/L (ref 0–32)
AST: 85 IU/L — ABNORMAL HIGH (ref 0–40)
Albumin/Globulin Ratio: 1.1 — ABNORMAL LOW (ref 1.2–2.2)
Alkaline Phosphatase: 323 IU/L — ABNORMAL HIGH (ref 39–117)
BUN / CREAT RATIO: 22 (ref 9–23)
BUN: 14 mg/dL (ref 6–24)
Bilirubin Total: 1 mg/dL (ref 0.0–1.2)
CALCIUM: 9.3 mg/dL (ref 8.7–10.2)
CO2: 23 mmol/L (ref 18–29)
CREATININE: 0.65 mg/dL (ref 0.57–1.00)
Chloride: 98 mmol/L (ref 96–106)
GFR, EST AFRICAN AMERICAN: 122 mL/min/{1.73_m2} (ref >59)
GFR, EST NON AFRICAN AMERICAN: 106 mL/min/{1.73_m2} (ref >59)
GLOBULIN, TOTAL: 3.8 g/dL (ref 1.5–4.5)
GLUCOSE: 115 mg/dL — AB (ref 65–99)
Potassium: 3.9 mmol/L (ref 3.5–5.2)
SODIUM: 142 mmol/L (ref 134–144)
TOTAL PROTEIN: 7.9 g/dL (ref 6.0–8.5)

## 2015-09-27 LAB — CBC WITH DIFFERENTIAL/PLATELET
BASOS ABS: 0 10*3/uL (ref 0.0–0.2)
Basos: 1 %
EOS (ABSOLUTE): 0.1 10*3/uL (ref 0.0–0.4)
Eos: 2 %
Hematocrit: 33.6 % — ABNORMAL LOW (ref 34.0–46.6)
Hemoglobin: 11.4 g/dL (ref 11.1–15.9)
Immature Grans (Abs): 0 10*3/uL (ref 0.0–0.1)
Immature Granulocytes: 0 %
LYMPHS ABS: 1.1 10*3/uL (ref 0.7–3.1)
Lymphs: 19 %
MCH: 35.3 pg — ABNORMAL HIGH (ref 26.6–33.0)
MCHC: 33.9 g/dL (ref 31.5–35.7)
MCV: 104 fL — ABNORMAL HIGH (ref 79–97)
MONOCYTES: 15 %
Monocytes Absolute: 0.8 10*3/uL (ref 0.1–0.9)
Neutrophils Absolute: 3.6 10*3/uL (ref 1.4–7.0)
Neutrophils: 63 %
PLATELETS: 116 10*3/uL — AB (ref 150–379)
RBC: 3.23 x10E6/uL — AB (ref 3.77–5.28)
RDW: 13.6 % (ref 12.3–15.4)
WBC: 5.7 10*3/uL (ref 3.4–10.8)

## 2015-09-27 LAB — GAMMA GT: GGT: 1773 IU/L (ref 0–60)

## 2015-09-27 LAB — SPECIMEN STATUS REPORT

## 2015-09-27 LAB — AMMONIA

## 2015-09-27 LAB — FOLATE: Folate: 7.1 ng/mL (ref >3.0)

## 2015-10-04 ENCOUNTER — Ambulatory Visit: Payer: Self-pay | Admitting: Internal Medicine

## 2015-10-04 ENCOUNTER — Encounter: Payer: Self-pay | Admitting: Internal Medicine

## 2015-10-04 VITALS — BP 114/86 | HR 106 | Temp 98.3°F | Wt 112.0 lb

## 2015-10-04 DIAGNOSIS — D6489 Other specified anemias: Secondary | ICD-10-CM

## 2015-10-04 DIAGNOSIS — M25512 Pain in left shoulder: Secondary | ICD-10-CM

## 2015-10-04 MED ORDER — CYCLOBENZAPRINE HCL 10 MG PO TABS: 10.0000 mg | ORAL_TABLET | Freq: Three times a day (TID) | ORAL | Status: DC

## 2015-10-04 NOTE — Progress Notes (Signed)
   Subjective:    Patient ID: Laura FortsDeanna M Tilghman, female    DOB: 01/30/1971, 45 y.o.   MRN: [id number]  HPI Patient Active Problem List   Diagnosis Date Noted  . Alcohol abuse 05/16/2015  . Substance induced mood disorder ([facility]) 05/16/2015  . Alcohol withdrawal ([facility]) 05/16/2015  . Hypertension 05/16/2015   Patient presents with shoulder and back pain. Patient has had injections in hip and shoulder for pain. She is not doing physical therapy at this time.  Patient takes amitriptyline for sleeping.  Review of Systems Patient's cirrhosis blood tests are stable.  She is mildly anemic, no edema. Last GGT shows that she is still consuming alcohol.      Objective:   Physical Exam  Constitutional: She is oriented to person, place, and time.  Cardiovascular: Normal rate, regular rhythm and normal heart sounds.   Pulmonary/Chest: Effort normal and breath sounds normal.  Neurological: She is alert and oriented to person, place, and time.    BP 114/86 mmHg  Pulse 106  Temp(Src) 98.3 F (36.8 C)  Wt 112 lb (50.803 kg)    Medication List       This list is accurate as of: 10/04/15 11:07 AM.  Always use your most recent med list.               amitriptyline 10 MG tablet  Commonly known as:  ELAVIL  Take 10 mg by mouth 2 (two) times daily.     ascorbic acid 250 MG tablet  Commonly known as:  VITAMIN C  Take 250 mg by mouth 2 (two) times daily.     atenolol 50 MG tablet  Commonly known as:  TENORMIN  Take 50 mg by mouth daily.     benazepril 10 MG tablet  Commonly known as:  LOTENSIN  Take 10 mg by mouth daily.     cyclobenzaprine 10 MG tablet  Commonly known as:  FLEXERIL  Take 1 tablet (10 mg total) by mouth 3 (three) times daily.     docusate sodium 100 MG capsule  Commonly known as:  COLACE  Take 100 mg by mouth 2 (two) times daily. Reported on 10/04/2015     folic acid 1 MG tablet  Commonly known as:  FOLVITE  Take 1 mg by mouth daily.     lactulose 10 GM/15ML  solution  Commonly known as:  CHRONULAC  Take 20 g by mouth 3 (three) times daily.     levETIRAcetam 1000 MG tablet  Commonly known as:  KEPPRA  Take 1,000 mg by mouth 2 (two) times daily. Reported on 10/04/2015     multivitamin tablet  Take 1 tablet by mouth daily.     thiamine 100 MG tablet  Commonly known as:  VITAMIN B-1  Take 100 mg by mouth daily.           Assessment & Plan:  Patient needs refill on Flexeril for pain.  90 tablets 3x day with 3 refills.Patient advised to quit drinking alcohol.  Labs ordered:   CBC, MetC, GGT, TSH and amonia level

## 2015-10-04 NOTE — Addendum Note (Signed)
Addended by: Bo McclintockSALISBURY, Lyall Faciane M on: 10/04/2015 11:16 AM   Modules accepted: Orders

## 2015-10-05 LAB — CBC WITH DIFFERENTIAL/PLATELET
BASOS ABS: 0.1 10*3/uL (ref 0.0–0.2)
Basos: 1 %
EOS (ABSOLUTE): 0.2 10*3/uL (ref 0.0–0.4)
Eos: 3 %
HEMOGLOBIN: 12 g/dL (ref 11.1–15.9)
Hematocrit: 36.7 % (ref 34.0–46.6)
IMMATURE GRANS (ABS): 0 10*3/uL (ref 0.0–0.1)
Immature Granulocytes: 0 %
LYMPHS: 17 %
Lymphocytes Absolute: 1.1 10*3/uL (ref 0.7–3.1)
MCH: 35.5 pg — AB (ref 26.6–33.0)
MCHC: 32.7 g/dL (ref 31.5–35.7)
MCV: 109 fL — ABNORMAL HIGH (ref 79–97)
MONOCYTES: 17 %
Monocytes Absolute: 1.1 10*3/uL — ABNORMAL HIGH (ref 0.1–0.9)
Neutrophils Absolute: 4 10*3/uL (ref 1.4–7.0)
Neutrophils: 62 %
PLATELETS: 128 10*3/uL — AB (ref 150–379)
RBC: 3.38 x10E6/uL — AB (ref 3.77–5.28)
RDW: 13.5 % (ref 12.3–15.4)
WBC: 6.4 10*3/uL (ref 3.4–10.8)

## 2015-10-05 LAB — GAMMA GT: GGT: 1493 IU/L — AB (ref 0–60)

## 2015-10-05 LAB — AMMONIA

## 2015-10-05 LAB — COMPREHENSIVE METABOLIC PANEL
ALBUMIN: 3.7 g/dL (ref 3.5–5.5)
ALK PHOS: 293 IU/L — AB (ref 39–117)
ALT: 17 IU/L (ref 0–32)
AST: 62 IU/L — AB (ref 0–40)
Albumin/Globulin Ratio: 1 — ABNORMAL LOW (ref 1.2–2.2)
BILIRUBIN TOTAL: 0.9 mg/dL (ref 0.0–1.2)
BUN / CREAT RATIO: 15 (ref 9–23)
BUN: 8 mg/dL (ref 6–24)
CHLORIDE: 100 mmol/L (ref 96–106)
CO2: 24 mmol/L (ref 18–29)
CREATININE: 0.52 mg/dL — AB (ref 0.57–1.00)
Calcium: 9.1 mg/dL (ref 8.7–10.2)
GFR calc Af Amer: 132 mL/min/{1.73_m2} (ref >59)
GFR calc non Af Amer: 114 mL/min/{1.73_m2} (ref >59)
GLOBULIN, TOTAL: 3.7 g/dL (ref 1.5–4.5)
Glucose: 95 mg/dL (ref 65–99)
POTASSIUM: 4.2 mmol/L (ref 3.5–5.2)
SODIUM: 140 mmol/L (ref 134–144)
Total Protein: 7.4 g/dL (ref 6.0–8.5)

## 2015-10-05 LAB — TSH: TSH: 0.553 u[IU]/mL (ref 0.450–4.500)

## 2016-01-03 ENCOUNTER — Encounter: Payer: Self-pay | Admitting: Internal Medicine

## 2016-01-03 ENCOUNTER — Ambulatory Visit: Payer: Self-pay | Admitting: Internal Medicine

## 2016-01-03 VITALS — BP 107/72 | HR 98 | Temp 98.2°F | Wt 116.0 lb

## 2016-01-03 DIAGNOSIS — F32A Depression, unspecified: Secondary | ICD-10-CM

## 2016-01-03 DIAGNOSIS — M25512 Pain in left shoulder: Secondary | ICD-10-CM

## 2016-01-03 DIAGNOSIS — K7031 Alcoholic cirrhosis of liver with ascites: Secondary | ICD-10-CM

## 2016-01-03 DIAGNOSIS — F329 Major depressive disorder, single episode, unspecified: Secondary | ICD-10-CM

## 2016-01-03 MED ORDER — AMITRIPTYLINE HCL 10 MG PO TABS: 10.0000 mg | ORAL_TABLET | Freq: Two times a day (BID) | ORAL | 0 refills | Status: DC

## 2016-01-03 MED ORDER — DOCUSATE SODIUM 100 MG PO CAPS: 100.0000 mg | ORAL_CAPSULE | Freq: Two times a day (BID) | ORAL | 0 refills | Status: DC

## 2016-01-03 MED ORDER — FOLIC ACID 1 MG PO TABS: 1.0000 mg | ORAL_TABLET | Freq: Every day | ORAL | 0 refills | Status: DC

## 2016-01-03 MED ORDER — ATENOLOL 50 MG PO TABS: 50.0000 mg | ORAL_TABLET | Freq: Every day | ORAL | 0 refills | Status: AC

## 2016-01-03 MED ORDER — LACTULOSE 10 GM/15ML PO SOLN: 20.0000 g | Freq: Three times a day (TID) | ORAL | 0 refills | Status: AC

## 2016-01-03 MED ORDER — CYCLOBENZAPRINE HCL 10 MG PO TABS: 10.0000 mg | ORAL_TABLET | Freq: Three times a day (TID) | ORAL | 0 refills | Status: DC

## 2016-01-03 MED ORDER — ASCORBIC ACID 250 MG PO TABS: 250.0000 mg | ORAL_TABLET | Freq: Two times a day (BID) | ORAL | 0 refills | Status: AC

## 2016-01-03 MED ORDER — LEVETIRACETAM 1000 MG PO TABS: 1000.0000 mg | ORAL_TABLET | Freq: Two times a day (BID) | ORAL | 0 refills | Status: DC

## 2016-01-03 NOTE — Progress Notes (Signed)
   Subjective:    Patient ID: Laura Heath, female    DOB: 11/19/1970, 45 y.o.   MRN: [id number]  HPI  Patient Active Problem List   Diagnosis Date Noted  . Alcohol abuse 05/16/2015  . Substance induced mood disorder ([facility]) 05/16/2015  . Alcohol withdrawal ([facility]) 05/16/2015  . Hypertension 05/16/2015  . Alcoholic cirrhosis ([facility]) 10/03/2014   Patient's fiance passed away and she is having issues with depression and anxiety.  Patient cannot sleep.  Patient complains of headaches for 3 days that radiates from the neck. Patients states she cannot get shoulder surgery due to her cirrhosis.  Review of Systems Patient is still drinking alcohol.  She is continuing to suffer with cirrhosis.  Patient has small warts on both feet.    Objective:   Physical Exam  Constitutional: She is oriented to person, place, and time.  Cardiovascular: Normal rate, regular rhythm and normal heart sounds.   Pulmonary/Chest: Effort normal and breath sounds normal.  Neurological: She is alert and oriented to person, place, and time.    BP 107/72   Pulse 98   Temp 98.2 F (36.8 C)   Wt 116 lb (52.6 kg)   BMI 20.55 kg/m     Medication List       Accurate as of 01/03/16 10:44 AM. Always use your most recent med list.          amitriptyline 10 MG tablet Commonly known as:  ELAVIL Take 10 mg by mouth 2 (two) times daily.   ascorbic acid 250 MG tablet Commonly known as:  VITAMIN C Take 250 mg by mouth 2 (two) times daily.   atenolol 50 MG tablet Commonly known as:  TENORMIN Take 50 mg by mouth daily.   benazepril 10 MG tablet Commonly known as:  LOTENSIN Take 10 mg by mouth daily.   cyclobenzaprine 10 MG tablet Commonly known as:  FLEXERIL Take 1 tablet (10 mg total) by mouth 3 (three) times daily.   docusate sodium 100 MG capsule Commonly known as:  COLACE Take 100 mg by mouth 2 (two) times daily. Reported on 10/04/2015   folic acid 1 MG tablet Commonly known as:  FOLVITE Take 1 mg  by mouth daily.   lactulose 10 GM/15ML solution Commonly known as:  CHRONULAC Take 20 g by mouth 3 (three) times daily.   levETIRAcetam 1000 MG tablet Commonly known as:  KEPPRA Take 1,000 mg by mouth 2 (two) times daily. Reported on 10/04/2015   multivitamin tablet Take 1 tablet by mouth daily.   thiamine 100 MG tablet Commonly known as:  VITAMIN B-1 Take 100 mg by mouth daily.            Assessment & Plan:   Patient needs referral to mental health for further treatment and medications.  Labs today and same labs again before 3 month follow up: Metc, GGT, CBC, TSH, lipids, UA and ammonia

## 2016-01-03 NOTE — Patient Instructions (Addendum)
Mental health referral and labs before 3 month f/u Metc, GGT, CBC, TSH, lipids, UA and ammonia

## 2016-01-04 LAB — COMPREHENSIVE METABOLIC PANEL
A/G RATIO: 1 — AB (ref 1.2–2.2)
ALBUMIN: 4 g/dL (ref 3.5–5.5)
ALK PHOS: 303 IU/L — AB (ref 39–117)
ALT: 22 IU/L (ref 0–32)
AST: 77 IU/L — ABNORMAL HIGH (ref 0–40)
BILIRUBIN TOTAL: 1.1 mg/dL (ref 0.0–1.2)
BUN / CREAT RATIO: 17 (ref 9–23)
BUN: 12 mg/dL (ref 6–24)
CHLORIDE: 100 mmol/L (ref 96–106)
CO2: 20 mmol/L (ref 18–29)
Calcium: 9.6 mg/dL (ref 8.7–10.2)
Creatinine, Ser: 0.72 mg/dL (ref 0.57–1.00)
GFR calc non Af Amer: 100 mL/min/{1.73_m2} (ref >59)
GFR, EST AFRICAN AMERICAN: 115 mL/min/{1.73_m2} (ref >59)
GLOBULIN, TOTAL: 4.1 g/dL (ref 1.5–4.5)
GLUCOSE: 81 mg/dL (ref 65–99)
POTASSIUM: 4.5 mmol/L (ref 3.5–5.2)
SODIUM: 140 mmol/L (ref 134–144)
TOTAL PROTEIN: 8.1 g/dL (ref 6.0–8.5)

## 2016-01-04 LAB — LIPID PANEL
Chol/HDL Ratio: 3.1 ratio units (ref 0.0–4.4)
Cholesterol, Total: 222 mg/dL — ABNORMAL HIGH (ref 100–199)
HDL: 71 mg/dL (ref >39)
LDL Calculated: 130 mg/dL — ABNORMAL HIGH (ref 0–99)
Triglycerides: 107 mg/dL (ref 0–149)
VLDL Cholesterol Cal: 21 mg/dL (ref 5–40)

## 2016-01-04 LAB — CBC WITH DIFFERENTIAL
BASOS ABS: 0.1 10*3/uL (ref 0.0–0.2)
Basos: 1 %
EOS (ABSOLUTE): 0.3 10*3/uL (ref 0.0–0.4)
EOS: 3 %
HEMATOCRIT: 38.7 % (ref 34.0–46.6)
Hemoglobin: 13.2 g/dL (ref 11.1–15.9)
IMMATURE GRANULOCYTES: 0 %
Immature Grans (Abs): 0 10*3/uL (ref 0.0–0.1)
LYMPHS ABS: 1.9 10*3/uL (ref 0.7–3.1)
Lymphs: 22 %
MCH: 35.5 pg — ABNORMAL HIGH (ref 26.6–33.0)
MCHC: 34.1 g/dL (ref 31.5–35.7)
MCV: 104 fL — ABNORMAL HIGH (ref 79–97)
MONOS ABS: 0.8 10*3/uL (ref 0.1–0.9)
Monocytes: 10 %
NEUTROS PCT: 64 %
Neutrophils Absolute: 5.6 10*3/uL (ref 1.4–7.0)
RBC: 3.72 x10E6/uL — AB (ref 3.77–5.28)
RDW: 14.8 % (ref 12.3–15.4)
WBC: 8.6 10*3/uL (ref 3.4–10.8)

## 2016-01-04 LAB — URINALYSIS
BILIRUBIN UA: NEGATIVE
GLUCOSE, UA: NEGATIVE
KETONES UA: NEGATIVE
NITRITE UA: NEGATIVE
RBC, UA: NEGATIVE
Specific Gravity, UA: 1.015 (ref 1.005–1.030)
UUROB: 1 mg/dL (ref 0.2–1.0)
pH, UA: 6 (ref 5.0–7.5)

## 2016-01-04 LAB — AMMONIA: Ammonia: 381 ug/dL (ref 19–87)

## 2016-01-04 LAB — TSH: TSH: 0.455 u[IU]/mL (ref 0.450–4.500)

## 2016-01-04 LAB — GAMMA GT: GGT: 1261 IU/L — AB (ref 0–60)

## 2016-01-09 ENCOUNTER — Encounter (HOSPITAL_COMMUNITY): Payer: Self-pay

## 2016-01-18 ENCOUNTER — Ambulatory Visit (INDEPENDENT_AMBULATORY_CARE_PROVIDER_SITE_OTHER): Payer: Self-pay | Admitting: Licensed Clinical Social Worker

## 2016-01-18 DIAGNOSIS — F32A Depression, unspecified: Secondary | ICD-10-CM

## 2016-01-18 DIAGNOSIS — F329 Major depressive disorder, single episode, unspecified: Secondary | ICD-10-CM
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
## 2016-01-24 ENCOUNTER — Telehealth: Payer: Self-pay

## 2016-01-24 NOTE — Telephone Encounter (Signed)
Called pt to go over test results. Dr. Candelaria Stagershaplin stated she needs to continue her lactulose as prescribed to get her amonia levels down. Pt sates she "does sometimes but it taste bad". I gave pt msg. PT verbalized understanding.

## 2016-01-26 ENCOUNTER — Ambulatory Visit: Payer: Self-pay | Admitting: Psychiatry

## 2016-01-30 ENCOUNTER — Ambulatory Visit (INDEPENDENT_AMBULATORY_CARE_PROVIDER_SITE_OTHER): Payer: Self-pay | Admitting: Psychiatry

## 2016-01-30 ENCOUNTER — Telehealth: Payer: Self-pay

## 2016-01-30 ENCOUNTER — Encounter: Payer: Self-pay | Admitting: Psychiatry

## 2016-01-30 VITALS — BP 107/69 | HR 94 | Temp 98.6°F | Ht 63.0 in | Wt 118.0 lb

## 2016-01-30 DIAGNOSIS — F39 Unspecified mood [affective] disorder: Secondary | ICD-10-CM

## 2016-01-30 DIAGNOSIS — F1020 Alcohol dependence, uncomplicated: Secondary | ICD-10-CM

## 2016-01-30 MED ORDER — MIRTAZAPINE 15 MG PO TABS: 15.0000 mg | ORAL_TABLET | Freq: Every day | ORAL | 1 refills | Status: DC

## 2016-01-30 NOTE — Telephone Encounter (Signed)
called and spoke with [facility].  she stated that patient does come there and then she asked me about what medication was it and she stated that remeron was not covered under them and that patient would have to pay out of pocket for it.  she state that she will call the pharmacy and the patient and explain that this medication patient will have to pay for they do not cover this medication.

## 2016-01-30 NOTE — Telephone Encounter (Signed)
Dr.Fahine's office called in stating they sent over a rx for remeron. According to our formulary we do not cover that rx. Called pt to inform her of this info. She stated "it was only $12 and she could get it herself" PT verbalized understanding about it not being covered.

## 2016-01-30 NOTE — Telephone Encounter (Signed)
L[facility] called about 2 things; 1.  there is a interaction between taking the flexeril and the remeron.  2.  the patient states that [facility] was to pay for that and they need proof or an ok to bill [facility].

## 2016-01-30 NOTE — Telephone Encounter (Signed)
noted 
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
## 2016-02-06 ENCOUNTER — Ambulatory Visit: Payer: Self-pay | Admitting: Licensed Clinical Social Worker

## 2016-02-15 ENCOUNTER — Ambulatory Visit (INDEPENDENT_AMBULATORY_CARE_PROVIDER_SITE_OTHER): Payer: Managed Care, Other (non HMO) | Admitting: Psychiatry

## 2016-02-15 ENCOUNTER — Ambulatory Visit (INDEPENDENT_AMBULATORY_CARE_PROVIDER_SITE_OTHER): Payer: Self-pay | Admitting: Licensed Clinical Social Worker

## 2016-02-15 DIAGNOSIS — F1020 Alcohol dependence, uncomplicated: Secondary | ICD-10-CM

## 2016-02-15 DIAGNOSIS — F39 Unspecified mood [affective] disorder: Secondary | ICD-10-CM | POA: Diagnosis not present

## 2016-02-15 NOTE — Progress Notes (Signed)
Psychiatric MD Progress Note  Patient Identification: Laura Heath MRN:  [id number] Date of Evaluation:  02/15/2016 Referral Source: [facility] Chief Complaint:    Visit Diagnosis:    ICD-9-CM ICD-10-CM   1. Episodic mood disorder (HCC) 296.90 F39   2. Alcohol use disorder, severe, dependence (HCC) 303.90 F10.20     History of Present Illness:    Patient is a 45 year old female who presented with her Boyfriend for follow-up. She was intoxicated at this time and her gait was unsteady. She reported that she fell last week as she was moving the television. She was not forthcoming during the interview. She reported that she continues to drink on a daily basis and has consumed 2 shots last night. She reported that she has picked up her medication after it was prescribed at the last appointment. [facility] and [facility] have told her that her ammonia level continues to be high. She is currently on supervised probation for 18 months and after that she will be on unsupervised probation for 3 more months. Patient reported that she is planning to start counseling in [place]. We discussed about going to intensive outpatient program in [place] but patient declined. She reported that she is not interested.   She currently denied having any suicidal ideations or plans. She reported that she does not use any other drugs besides alcohol.     She has long history of cirrhosis of liver due to lifelong history of drinking alcohol. She consumed alcohol yesterday.    Associated Signs/Symptoms: Depression Symptoms:  depressed mood, anhedonia, insomnia, psychomotor retardation, fatigue, hopelessness, impaired memory, anxiety, loss of energy/fatigue, disturbed sleep, weight loss, decreased appetite, (Hypo) Manic Symptoms:  Labiality of Mood, Anxiety Symptoms:  Excessive Worry, Psychotic Symptoms:  denied PTSD Symptoms: Had a traumatic exposure:  fiance passed  away in her house  Past Psychiatric History:  H/o cirrhosis H/o seizures- last one 2 months  Psychiatric hospitalization- [place] [facility] [place].    Previous Psychotropic Medications: Paxil Klonopin  Substance Abuse History in the last 12 months:  Yes.     Started drinking at age 47.  Drinks vodka.  Rehab- once- [place]    Consequences of Substance Abuse: Blackouts:  seizures, DT DT's: yes Withdrawal Symptoms:   Headaches Tremors  Past Medical History:  Past Medical History:  Diagnosis Date  . Anxiety   . Chronic alcohol abuse    severe, "half-gallon every 1-2 days" per family  . CVA (cerebral infarction)   . Depression   . Fracture of humerus, proximal, left, closed 09/28/2014  . Hypertension   . Liver disease   . Seizures (HCC)   . Stroke [facility])     Past Surgical History:  Procedure Laterality Date  . ABDOMINAL HYSTERECTOMY    . BREAST SURGERY    . COSMETIC SURGERY    . TONSILLECTOMY      Family Psychiatric History:  Anxiety Depression  Family History:  Family History  Problem Relation Age of Onset  . Stroke Mother   . Parkinson's disease Father   . Anxiety disorder Brother   . Depression Brother   . Bipolar disorder Brother   . Seizures Paternal Grandfather   . Epilepsy Paternal Grandfather     Social History:   Social History   Social History  . Marital status: Divorced    Spouse name: N/A  . Number of children: N/A  . Years of education: N/A   Social  History Main Topics  . Smoking status: Current Every Day Smoker    Packs/day: 0.50    Types: Cigarettes  . Smokeless tobacco: Current User  . Alcohol use 2.4 - 3.0 oz/week    4 - 5 Shots of liquor per week     Comment: half gallon of liquor   . Drug use: No  . Sexual activity: No   Other Topics Concern  . Not on file   Social History Narrative  . No narrative on file    Additional Social History:  Married x 18 years Currently separated from her  husband.   Allergies:   Allergies  Allergen Reactions  . Prednisone Nausea And Vomiting  . Oxycodone-Acetaminophen Nausea And Vomiting    Metabolic Disorder Labs: No results found for: HGBA1C, MPG No results found for: PROLACTIN Lab Results  Component Value Date   CHOL 222 (H) 01/03/2016   TRIG 107 01/03/2016   HDL 71 01/03/2016   CHOLHDL 3.1 01/03/2016   LDLCALC 130 (H) 01/03/2016   LDLCALC 170 02/01/2015     Current Medications: Current Outpatient Prescriptions  Medication Sig Dispense Refill  . amitriptyline (ELAVIL) 10 MG tablet Take 1 tablet (10 mg total) by mouth 2 (two) times daily. 180 tablet 0  . ascorbic acid (VITAMIN C) 250 MG tablet Take 1 tablet (250 mg total) by mouth 2 (two) times daily. 180 tablet 0  . atenolol (TENORMIN) 50 MG tablet Take 1 tablet (50 mg total) by mouth daily. 90 tablet 0  . benazepril (LOTENSIN) 10 MG tablet Take 10 mg by mouth daily.    . cyclobenzaprine (FLEXERIL) 10 MG tablet Take 1 tablet (10 mg total) by mouth 3 (three) times daily. 270 tablet 0  . docusate sodium (COLACE) 100 MG capsule Take 1 capsule (100 mg total) by mouth 2 (two) times daily. Reported on 10/04/2015 10 capsule 0  . folic acid (FOLVITE) 1 MG tablet Take 1 tablet (1 mg total) by mouth daily. 90 tablet 0  . lactulose (CHRONULAC) 10 GM/15ML solution Take 30 mLs (20 g total) by mouth 3 (three) times daily. 240 mL 0  . levETIRAcetam (KEPPRA) 1000 MG tablet Take 1 tablet (1,000 mg total) by mouth 2 (two) times daily. Reported on 10/04/2015 180 tablet 0  . mirtazapine (REMERON) 15 MG tablet Take 1 tablet (15 mg total) by mouth at bedtime. 30 tablet 1  . Multiple Vitamin (MULTIVITAMIN) tablet Take 1 tablet by mouth daily.    Marland Kitchen. thiamine (VITAMIN B-1) 100 MG tablet Take 100 mg by mouth daily.     No current facility-administered medications for this visit.     Neurologic: Headache: No Seizure: Yes Paresthesias:No  Musculoskeletal: Strength & Muscle Tone: within normal  limits Gait & Station: normal Patient leans: N/A  Psychiatric Specialty Exam: ROS  There were no vitals taken for this visit.There is no height or weight on file to calculate BMI.  General Appearance: Casual  Eye Contact:  Fair  Speech:  Slow  Volume:  Normal  Mood:  Anxious  Affect:  Congruent  Thought Process:  Coherent  Orientation:  Full (Time, Place, and Person)  Thought Content:  WDL  Suicidal Thoughts:  No  Homicidal Thoughts:  No  Memory:  Immediate;   Fair Recent;   Fair Remote;   Fair  Judgement:  Intact  Insight:  Fair  Psychomotor Activity:  Normal  Concentration:  Concentration: Fair and Attention Span: Fair  Recall:  FiservFair  Fund of Knowledge:Fair  Language: Fair  Akathisia:  No  Handed:  Right  AIMS (if indicated):    Assets:  Communication Skills Social Support  ADL's:  Intact  Cognition: WNL  Sleep:      Treatment Plan Summary: Medication management    Patient will be referred to [facility]  for substance abuse treatment due to being intoxicated and unable to participate in the program at this time. she is not appropriate for this clinic Pt and her boyfriend demonstrated  understanding. She will not be prescribed any medications at this time.    No Follow-up appointments will be scheduled at this time.   More than 50% of the time spent in psychoeducation, counseling and coordination of care.    This note was generated in part or whole with voice recognition software. Voice regonition is usually quite accurate but there are transcription errors that can and very often do occur. I apologize for any typographical errors that were not detected and corrected.    Brandy Hale, MD 10/12/201711:13 AM

## 2016-02-16 NOTE — Progress Notes (Signed)
   THERAPIST PROGRESS NOTE  Session Time: 30  Participation Level: Active  Behavioral Response: DisheveledConfused  Type of Therapy: Family Therapy  Treatment Goals addressed: Coping  Interventions: CBT, Motivational Interviewing, Solution Focused, Strength-based, Supportive, Family Systems and Reframing  Summary: Laura Heath is a 45 y.o. female who presents with her boyfriend.  Discussion of her current mood and stressors since the last session.  Discussion of her current alcohol intake/consumption.  She was intoxicated at this time. She reported that she fell last week as she was moving the television. She was not forthcoming during the interview. She reported that she continues to drink on a daily basis to assist with reducing the thoughts of her previous boyfriend who recently passed away in her home. Discussion of her being open with the doctor about her alcohol consumption.  Encouraged [facility] to [facility] or a local Substance Abuse treatment facility.    Suicidal/Homicidal: No  Therapist Response:  Assessed pt current functioning per pt report.  Focused on her alcohol consumption and the importance of receiving treatment.  Plan: Obtain Substance Abuse Treatment upon discharge resume OPT.  Diagnosis: Axis I: Mood Disorder NOS    Axis II: No diagnosis    Marinda Elkicole M Peacock, LCSW 02/16/2016

## 2016-03-05 ENCOUNTER — Telehealth: Payer: Self-pay | Admitting: Urology

## 2016-03-05 ENCOUNTER — Telehealth: Payer: Self-pay

## 2016-03-05 NOTE — Telephone Encounter (Signed)
Pt has been calling in wanting to know when she will get an appt with the dental clinic. I got in touch with the lady today and she should be calling her to make an appt. If she has not heard anything in a week I would like her to call me back. Left msg

## 2016-03-05 NOTE — Telephone Encounter (Signed)
Returning missed call from Mandy °

## 2016-04-05 ENCOUNTER — Other Ambulatory Visit: Payer: Self-pay

## 2016-04-05 DIAGNOSIS — G479 Sleep disorder, unspecified: Secondary | ICD-10-CM

## 2016-04-05 MED ORDER — AMITRIPTYLINE HCL 10 MG PO TABS: 10.0000 mg | ORAL_TABLET | Freq: Two times a day (BID) | ORAL | 0 refills | Status: DC

## 2016-04-05 NOTE — Telephone Encounter (Signed)
Pt called in and needs refill for amitriptyline. I am going to send in a weeks worth and she will discuss with Dr. Candelaria Stagershaplin on the 13th for more refills.

## 2016-04-10 ENCOUNTER — Other Ambulatory Visit: Payer: Self-pay

## 2016-04-10 DIAGNOSIS — K7031 Alcoholic cirrhosis of liver with ascites: Secondary | ICD-10-CM

## 2016-04-10 DIAGNOSIS — G479 Sleep disorder, unspecified: Secondary | ICD-10-CM

## 2016-04-10 NOTE — Telephone Encounter (Signed)
[facility] for Amitriptyline HCL 10mg .

## 2016-04-11 LAB — COMPREHENSIVE METABOLIC PANEL
A/G RATIO: 1.1 — AB (ref 1.2–2.2)
ALT: 32 IU/L (ref 0–32)
AST: 117 IU/L — AB (ref 0–40)
Albumin: 4.4 g/dL (ref 3.5–5.5)
Alkaline Phosphatase: 250 IU/L — ABNORMAL HIGH (ref 39–117)
BUN/Creatinine Ratio: 48 — ABNORMAL HIGH (ref 9–23)
BUN: 31 mg/dL — ABNORMAL HIGH (ref 6–24)
Bilirubin Total: 1.6 mg/dL — ABNORMAL HIGH (ref 0.0–1.2)
CALCIUM: 9.6 mg/dL (ref 8.7–10.2)
CHLORIDE: 100 mmol/L (ref 96–106)
CO2: 21 mmol/L (ref 18–29)
Creatinine, Ser: 0.65 mg/dL (ref 0.57–1.00)
GFR calc Af Amer: 122 mL/min/{1.73_m2} (ref >59)
GFR, EST NON AFRICAN AMERICAN: 106 mL/min/{1.73_m2} (ref >59)
GLUCOSE: 99 mg/dL (ref 65–99)
Globulin, Total: 4.1 g/dL (ref 1.5–4.5)
POTASSIUM: 4.4 mmol/L (ref 3.5–5.2)
Sodium: 143 mmol/L (ref 134–144)
Total Protein: 8.5 g/dL (ref 6.0–8.5)

## 2016-04-11 LAB — CBC WITH DIFFERENTIAL
BASOS ABS: 0.1 10*3/uL (ref 0.0–0.2)
Basos: 1 %
EOS (ABSOLUTE): 0.1 10*3/uL (ref 0.0–0.4)
Eos: 1 %
Hematocrit: 35.5 % (ref 34.0–46.6)
Hemoglobin: 12.4 g/dL (ref 11.1–15.9)
IMMATURE GRANULOCYTES: 0 %
Immature Grans (Abs): 0 10*3/uL (ref 0.0–0.1)
Lymphocytes Absolute: 1.4 10*3/uL (ref 0.7–3.1)
Lymphs: 14 %
MCH: 35.4 pg — ABNORMAL HIGH (ref 26.6–33.0)
MCHC: 34.9 g/dL (ref 31.5–35.7)
MCV: 101 fL — AB (ref 79–97)
MONOS ABS: 1.5 10*3/uL — AB (ref 0.1–0.9)
Monocytes: 14 %
NEUTROS PCT: 70 %
Neutrophils Absolute: 7.2 10*3/uL — ABNORMAL HIGH (ref 1.4–7.0)
RBC: 3.5 x10E6/uL — AB (ref 3.77–5.28)
RDW: 15 % (ref 12.3–15.4)
WBC: 10.2 10*3/uL (ref 3.4–10.8)

## 2016-04-11 LAB — URINALYSIS
BILIRUBIN UA: NEGATIVE
Glucose, UA: NEGATIVE
Nitrite, UA: POSITIVE — AB
PH UA: 6.5 (ref 5.0–7.5)
Specific Gravity, UA: 1.029 (ref 1.005–1.030)
UUROB: 1 mg/dL (ref 0.2–1.0)

## 2016-04-11 LAB — LIPID PANEL
CHOLESTEROL TOTAL: 288 mg/dL — AB (ref 100–199)
Chol/HDL Ratio: 2.2 ratio units (ref 0.0–4.4)
HDL: 129 mg/dL (ref >39)
LDL Calculated: 142 mg/dL — ABNORMAL HIGH (ref 0–99)
Triglycerides: 84 mg/dL (ref 0–149)
VLDL CHOLESTEROL CAL: 17 mg/dL (ref 5–40)

## 2016-04-11 LAB — AMMONIA

## 2016-04-11 LAB — GAMMA GT: GGT: 1804 IU/L (ref 0–60)

## 2016-04-11 LAB — TSH: TSH: 0.305 u[IU]/mL — AB (ref 0.450–4.500)

## 2016-04-11 MED ORDER — AMITRIPTYLINE HCL 10 MG PO TABS: 10.0000 mg | ORAL_TABLET | Freq: Two times a day (BID) | ORAL | 0 refills | Status: DC

## 2016-04-17 ENCOUNTER — Ambulatory Visit: Payer: Self-pay | Admitting: Internal Medicine

## 2016-04-17 ENCOUNTER — Encounter: Payer: Self-pay | Admitting: Internal Medicine

## 2016-04-17 VITALS — BP 111/68 | HR 86 | Temp 98.0°F | Wt 123.0 lb

## 2016-04-17 DIAGNOSIS — K7030 Alcoholic cirrhosis of liver without ascites: Secondary | ICD-10-CM

## 2016-04-17 DIAGNOSIS — I10 Essential (primary) hypertension: Secondary | ICD-10-CM

## 2016-04-17 DIAGNOSIS — G479 Sleep disorder, unspecified: Secondary | ICD-10-CM

## 2016-04-17 MED ORDER — AMITRIPTYLINE HCL 10 MG PO TABS: 10.0000 mg | ORAL_TABLET | Freq: Two times a day (BID) | ORAL | 10 refills | Status: AC

## 2016-04-17 MED ORDER — LEVETIRACETAM 1000 MG PO TABS: 1000.0000 mg | ORAL_TABLET | Freq: Two times a day (BID) | ORAL | 1 refills | Status: AC

## 2016-04-17 MED ORDER — CYCLOBENZAPRINE HCL 10 MG PO TABS
10.0000 mg | ORAL_TABLET | Freq: Three times a day (TID) | ORAL | 2 refills | Status: DC
Start: 2016-04-17 — End: 2016-07-17

## 2016-04-17 MED ORDER — BENAZEPRIL HCL 10 MG PO TABS: 10.0000 mg | ORAL_TABLET | Freq: Every day | ORAL | 5 refills | Status: AC

## 2016-04-17 NOTE — Progress Notes (Signed)
   Subjective:    Patient ID: Laura Heath, female    DOB: 1970/08/20, 45 y.o.   MRN: [id number]  HPI   Pt f/u for labs. Pt chief complaints are shoulder pain and constipation.  Pt reports no recent seizures. Reports occasional disorientation. Reports excessive shaking when she wakes up.   Patient Active Problem List   Diagnosis Date Noted  . Alcohol abuse 05/16/2015  . Substance induced mood disorder ([place]) 05/16/2015  . Alcohol withdrawal ([place]) 05/16/2015  . Hypertension 05/16/2015  . Alcoholic cirrhosis ([place]) 16/74/2552  . Closed fracture of proximal end of humerus 10/03/2014  . Macrocytic anemia 10/03/2014  . Thrombocytopenia ([place]) 10/03/2014  . Subdural hematoma ([place]) 10/02/2014     Medication List       Accurate as of 04/17/16 11:56 AM. Always use your most recent med list.          amitriptyline 10 MG tablet Commonly known as:  ELAVIL Take 1 tablet (10 mg total) by mouth 2 (two) times daily.   ascorbic acid 250 MG tablet Commonly known as:  VITAMIN C Take 1 tablet (250 mg total) by mouth 2 (two) times daily.   atenolol 50 MG tablet Commonly known as:  TENORMIN Take 1 tablet (50 mg total) by mouth daily.   benazepril 10 MG tablet Commonly known as:  LOTENSIN Take 1 tablet (10 mg total) by mouth daily.   cyclobenzaprine 10 MG tablet Commonly known as:  FLEXERIL Take 1 tablet (10 mg total) by mouth 3 (three) times daily.   lactulose 10 GM/15ML solution Commonly known as:  CHRONULAC Take 30 mLs (20 g total) by mouth 3 (three) times daily.   levETIRAcetam 1000 MG tablet Commonly known as:  KEPPRA Take 1 tablet (1,000 mg total) by mouth 2 (two) times daily. Reported on 10/04/2015   mirtazapine 15 MG tablet Commonly known as:  REMERON Take 1 tablet (15 mg total) by mouth at bedtime.   multivitamin tablet Take 1 tablet by mouth daily.   thiamine 100 MG tablet Commonly known as:  VITAMIN B-1 Take 100 mg by mouth daily.        Review of  Systems     Objective:   Physical Exam  Constitutional: She is oriented to person, place, and time.  Cardiovascular: Normal rate, regular rhythm and normal heart sounds.   Pulmonary/Chest: Effort normal and breath sounds normal.  Neurological: She is alert and oriented to person, place, and time.    BP 111/68   Pulse 86   Temp 98 F (36.7 C)   Wt 123 lb (55.8 kg)   BMI 21.79 kg/m        Assessment & Plan:   Labs today: UA and culture F/u in 3 months w/ labs: Met C, CBC, Ua, Ammonia,

## 2016-04-17 NOTE — Patient Instructions (Signed)
Labs today F/u in 3 months w/ labs

## 2016-04-21 LAB — MICROSCOPIC EXAMINATION: CASTS: NONE SEEN /LPF

## 2016-04-21 LAB — UA/M W/RFLX CULTURE, ROUTINE
Bilirubin, UA: NEGATIVE
Glucose, UA: NEGATIVE
Ketones, UA: NEGATIVE
Nitrite, UA: POSITIVE — AB
PH UA: 7 (ref 5.0–7.5)
Protein, UA: NEGATIVE
RBC UA: NEGATIVE
Specific Gravity, UA: 1.009 (ref 1.005–1.030)
Urobilinogen, Ur: 0.2 mg/dL (ref 0.2–1.0)

## 2016-04-21 LAB — URINE CULTURE, REFLEX

## 2016-05-02 ENCOUNTER — Other Ambulatory Visit: Payer: Self-pay | Admitting: Psychiatry

## 2016-05-08 ENCOUNTER — Telehealth: Payer: Self-pay

## 2016-05-08 DIAGNOSIS — N3090 Cystitis, unspecified without hematuria: Secondary | ICD-10-CM

## 2016-05-08 MED ORDER — SULFAMETHOXAZOLE-TRIMETHOPRIM 800-160 MG PO TABS: 1.0000 | ORAL_TABLET | Freq: Two times a day (BID) | ORAL | 0 refills | Status: AC

## 2016-05-08 NOTE — Telephone Encounter (Signed)
Rx sent per Dr Candelaria Stagerschaplin for bactrim to pharmacy for possible bladder infection. Called pt and gave results and directions for rx. Pt verbalized understanding.

## 2016-05-13 ENCOUNTER — Other Ambulatory Visit: Payer: Self-pay | Admitting: Psychiatry

## 2016-05-13 NOTE — Telephone Encounter (Signed)
Pt was referred to [facility] for further care.

## 2016-05-25 ENCOUNTER — Other Ambulatory Visit: Payer: Self-pay | Admitting: Psychiatry

## 2016-05-27 ENCOUNTER — Other Ambulatory Visit: Payer: Self-pay | Admitting: Psychiatry

## 2016-07-10 ENCOUNTER — Other Ambulatory Visit: Payer: Managed Care, Other (non HMO)

## 2016-07-10 DIAGNOSIS — I10 Essential (primary) hypertension: Secondary | ICD-10-CM

## 2016-07-10 DIAGNOSIS — F1010 Alcohol abuse, uncomplicated: Secondary | ICD-10-CM

## 2016-07-11 ENCOUNTER — Other Ambulatory Visit: Payer: Self-pay

## 2016-07-11 DIAGNOSIS — N159 Renal tubulo-interstitial disease, unspecified: Secondary | ICD-10-CM

## 2016-07-11 LAB — COMPREHENSIVE METABOLIC PANEL
ALK PHOS: 437 IU/L — AB (ref 39–117)
ALT: 32 IU/L (ref 0–32)
AST: 102 IU/L — ABNORMAL HIGH (ref 0–40)
Albumin/Globulin Ratio: 0.4 — ABNORMAL LOW (ref 1.2–2.2)
Albumin: 2.4 g/dL — ABNORMAL LOW (ref 3.5–5.5)
BILIRUBIN TOTAL: 7.5 mg/dL — AB (ref 0.0–1.2)
BUN / CREAT RATIO: 11 (ref 9–23)
BUN: 10 mg/dL (ref 6–24)
CHLORIDE: 94 mmol/L — AB (ref 96–106)
CO2: 21 mmol/L (ref 18–29)
Calcium: 8 mg/dL — ABNORMAL LOW (ref 8.7–10.2)
Creatinine, Ser: 0.9 mg/dL (ref 0.57–1.00)
GFR calc Af Amer: 87 mL/min/{1.73_m2} (ref >59)
GFR calc non Af Amer: 76 mL/min/{1.73_m2} (ref >59)
GLUCOSE: 164 mg/dL — AB (ref 65–99)
Globulin, Total: 5.4 g/dL — ABNORMAL HIGH (ref 1.5–4.5)
Potassium: 4.2 mmol/L (ref 3.5–5.2)
Sodium: 133 mmol/L — ABNORMAL LOW (ref 134–144)
Total Protein: 7.8 g/dL (ref 6.0–8.5)

## 2016-07-11 LAB — CBC
HEMATOCRIT: 28.3 % — AB (ref 34.0–46.6)
HEMOGLOBIN: 9.9 g/dL — AB (ref 11.1–15.9)
MCH: 34.6 pg — ABNORMAL HIGH (ref 26.6–33.0)
MCHC: 35 g/dL (ref 31.5–35.7)
MCV: 99 fL — ABNORMAL HIGH (ref 79–97)
Platelets: 107 10*3/uL — ABNORMAL LOW (ref 150–379)
RBC: 2.86 x10E6/uL — ABNORMAL LOW (ref 3.77–5.28)
RDW: 17.5 % — AB (ref 12.3–15.4)
WBC: 8.5 10*3/uL (ref 3.4–10.8)

## 2016-07-11 LAB — URINALYSIS
BILIRUBIN UA: POSITIVE — AB
GLUCOSE, UA: NEGATIVE
Nitrite, UA: POSITIVE — AB
RBC UA: NEGATIVE
Specific Gravity, UA: 1.021 (ref 1.005–1.030)
UUROB: 2 mg/dL — AB (ref 0.2–1.0)
pH, UA: 5 (ref 5.0–7.5)

## 2016-07-11 LAB — AMMONIA

## 2016-07-11 LAB — GAMMA GT: GGT: 550 IU/L — ABNORMAL HIGH (ref 0–60)

## 2016-07-14 LAB — URINE CULTURE

## 2016-07-17 ENCOUNTER — Encounter: Payer: Self-pay | Admitting: Internal Medicine

## 2016-07-17 ENCOUNTER — Other Ambulatory Visit: Payer: Self-pay | Admitting: Urology

## 2016-07-17 ENCOUNTER — Ambulatory Visit: Payer: Self-pay | Admitting: Internal Medicine

## 2016-07-17 VITALS — BP 103/65 | HR 99 | Temp 98.1°F | Wt 122.1 lb

## 2016-07-17 DIAGNOSIS — K7469 Other cirrhosis of liver: Secondary | ICD-10-CM

## 2016-07-17 MED ORDER — VITAMIN B-1 100 MG PO TABS: 100.0000 mg | ORAL_TABLET | Freq: Every day | ORAL | 3 refills | Status: AC

## 2016-07-17 MED ORDER — CYCLOBENZAPRINE HCL 10 MG PO TABS: 10.0000 mg | ORAL_TABLET | Freq: Three times a day (TID) | ORAL | 2 refills | Status: AC

## 2016-07-17 MED ORDER — MIRTAZAPINE 15 MG PO TABS: 15.0000 mg | ORAL_TABLET | Freq: Every day | ORAL | 2 refills | Status: AC

## 2016-07-17 MED ORDER — NITROFURANTOIN MONOHYD MACRO 100 MG PO CAPS: 100.0000 mg | ORAL_CAPSULE | Freq: Two times a day (BID) | ORAL | 0 refills | Status: AC

## 2016-07-17 NOTE — Progress Notes (Signed)
Subjective:    Patient ID: Laura Heath, female    DOB: November 16, 1970, 46 y.o.   MRN: [id number]  HPI   Pt f/u for lab results. Pt reports a recent fall.  Pt denies confusion or disorientation.  Mother with pt reports pt has been sick with flu-like symptoms for past month. Reports pt exhibits signs of confusion and disorientation.  Mother reports confidentially pt not taking meds appropriately, not eating well, not taking care of hygiene and home care appropriately, and continues to drink significant alcoholic beverage.   Patient Active Problem List   Diagnosis Date Noted  . Alcohol abuse 05/16/2015  . Substance induced mood disorder ([place]) 05/16/2015  . Alcohol withdrawal ([place]) 05/16/2015  . Hypertension 05/16/2015  . Alcoholic cirrhosis ([place]) 31/49/7026  . Closed fracture of proximal end of humerus 10/03/2014  . Macrocytic anemia 10/03/2014  . Thrombocytopenia ([place]) 10/03/2014  . Subdural hematoma (HCC) 10/02/2014   Allergies as of 07/17/2016      Reactions   Prednisone Nausea And Vomiting   Oxycodone-acetaminophen Nausea And Vomiting   No reaction occurred.       Medication List       Accurate as of 07/17/16 10:38 AM. Always use your most recent med list.          amitriptyline 10 MG tablet Commonly known as:  ELAVIL Take 1 tablet (10 mg total) by mouth 2 (two) times daily.   ascorbic acid 250 MG tablet Commonly known as:  VITAMIN C Take 1 tablet (250 mg total) by mouth 2 (two) times daily.   atenolol 50 MG tablet Commonly known as:  TENORMIN Take 1 tablet (50 mg total) by mouth daily.   benazepril 10 MG tablet Commonly known as:  LOTENSIN Take 1 tablet (10 mg total) by mouth daily.   cyclobenzaprine 10 MG tablet Commonly known as:  FLEXERIL Take 1 tablet (10 mg total) by mouth 3 (three) times daily.   lactulose 10 GM/15ML solution Commonly known as:  CHRONULAC Take 30 mLs (20 g total) by mouth 3 (three) times daily.   levETIRAcetam 1000 MG  tablet Commonly known as:  KEPPRA Take 1 tablet (1,000 mg total) by mouth 2 (two) times daily. Reported on 10/04/2015   mirtazapine 15 MG tablet Commonly known as:  REMERON Take 1 tablet (15 mg total) by mouth at bedtime.   multivitamin tablet Take 1 tablet by mouth daily.   nitrofurantoin (macrocrystal-monohydrate) 100 MG capsule Commonly known as:  MACROBID Take 1 capsule (100 mg total) by mouth every 12 (twelve) hours.   sulfamethoxazole-trimethoprim 800-160 MG tablet Commonly known as:  BACTRIM DS,SEPTRA DS Take 1 tablet by mouth 2 (two) times daily.   thiamine 100 MG tablet Commonly known as:  VITAMIN B-1 Take 100 mg by mouth daily.         Review of Systems  Bladder infection is resistant to antibiotics.  Lab results show liver levels( Bilirubin up to 7.5) has increased dramatically, Hemoglobin counts are lower..      Objective:   Physical Exam  Constitutional: She is oriented to person, place, and time.  Cardiovascular: Normal rate, regular rhythm and normal heart sounds.   Pulmonary/Chest: Effort normal and breath sounds normal.  Neurological: She is alert and oriented to person, place, and time.    BP 103/65   Pulse 99   Temp 98.1 F (36.7 C)   Wt 122 lb 1.6 oz (55.4 kg)   BMI 21.63 kg/m   Ankles are swollen.  Jaundice is present.     Assessment & Plan:   F/u in 3 months w/ labs: Met C, CBC, Ammonia levels Labs today: UA and urine culture  Discussed end of life options with pt. Pt not interested in making end of life medical decisions at this time.

## 2016-07-17 NOTE — Patient Instructions (Signed)
F/u in 3 months w/ labs Labs today

## 2016-07-18 LAB — URINALYSIS
Bilirubin, UA: POSITIVE — AB
Glucose, UA: NEGATIVE
KETONES UA: NEGATIVE
NITRITE UA: NEGATIVE
Protein, UA: NEGATIVE
RBC, UA: NEGATIVE
Specific Gravity, UA: 1.008 (ref 1.005–1.030)
Urobilinogen, Ur: 2 mg/dL — ABNORMAL HIGH (ref 0.2–1.0)
pH, UA: 5.5 (ref 5.0–7.5)

## 2016-08-04 DEATH — deceased

## 2016-10-16 ENCOUNTER — Other Ambulatory Visit: Payer: Self-pay

## 2016-10-23 ENCOUNTER — Ambulatory Visit: Payer: Self-pay | Admitting: Internal Medicine

## 2017-02-01 IMAGING — CT CT CERVICAL SPINE W/O CM
2 series · 10 of 14 positions shown, 12 images · non-contrast
Comparison: None.

CLINICAL DATA: Multiple falls, altered mental status

EXAM:
CT CERVICAL SPINE WITHOUT CONTRAST
TECHNIQUE: Multidetector CT imaging of the cervical spine was performed without
intravenous contrast. Multiplanar CT image reconstructions were also
generated.

[Series 3: c spine soft · axial · 0.26mm/px · z∈[+315,+419]mm · 5 of 80 slices shown]
[im 14/80  soft-tissue]
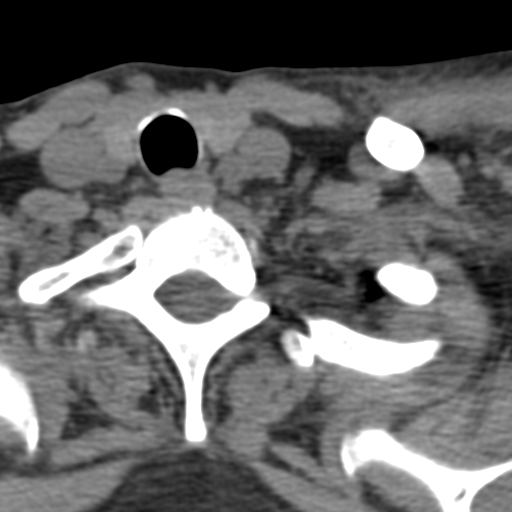
[im 27/80  soft-tissue]
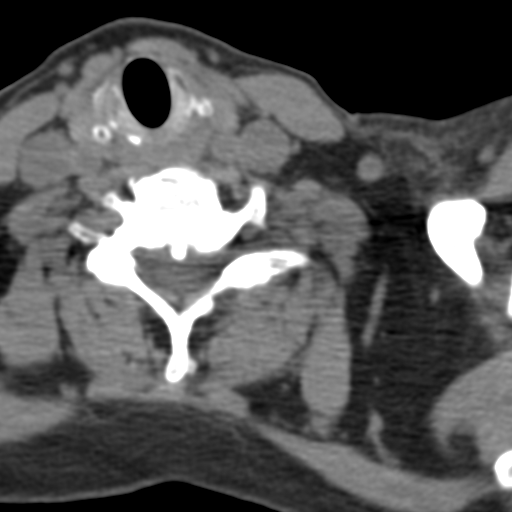
[im 40/80  soft-tissue]
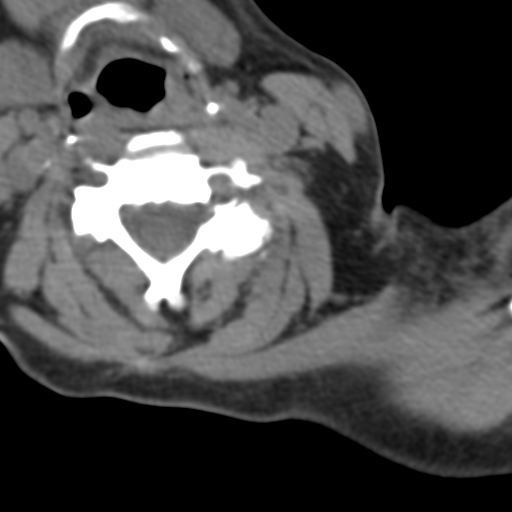
[im 53/80  soft-tissue]
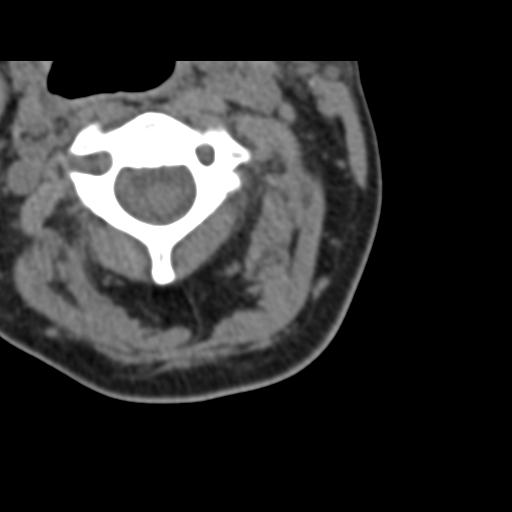
[im 66/80  soft-tissue]
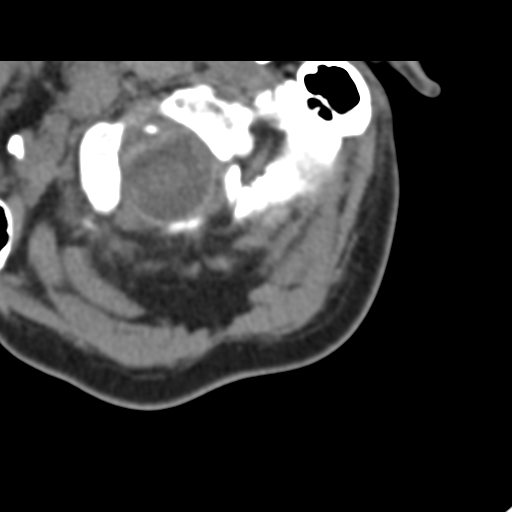

[Series 8: orthogonal axials · axial · 0.19mm/px · z∈[+298,+395]mm · 5 of 80 slices shown, 7 images]
[im 14/80  soft-tissue]
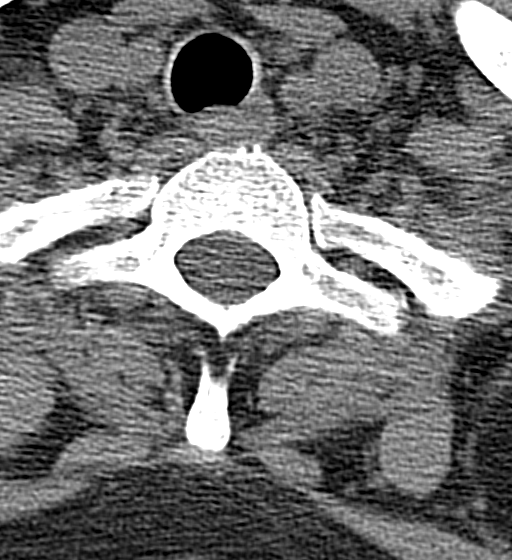
[im 14/80  bone]
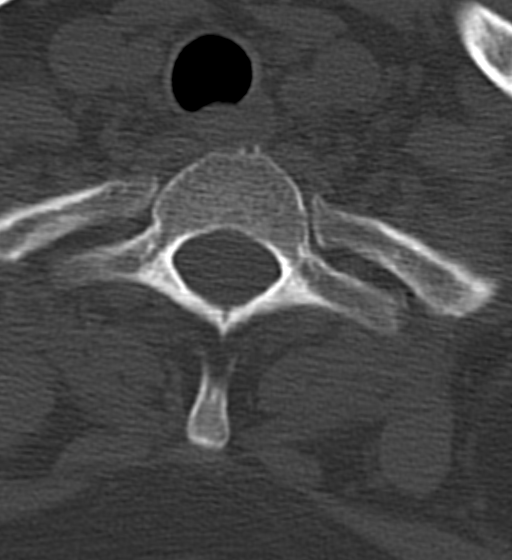
[im 27/80  bone]
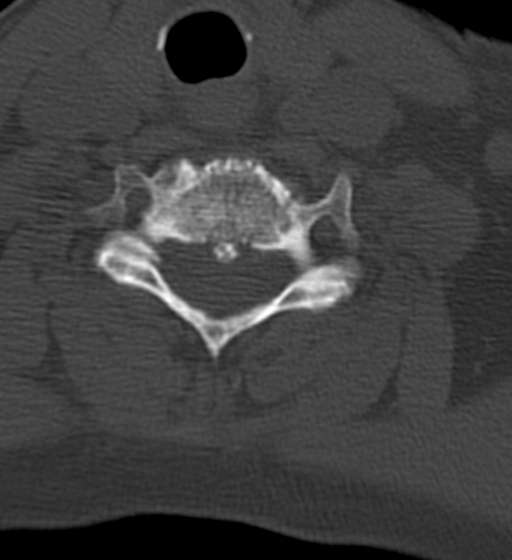
[im 40/80  bone]
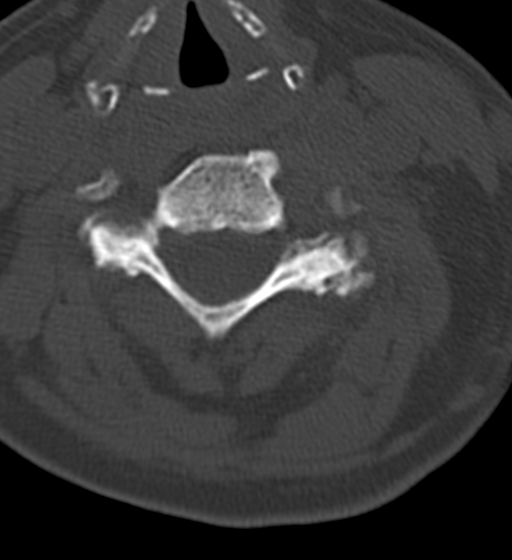
[im 53/80  bone]
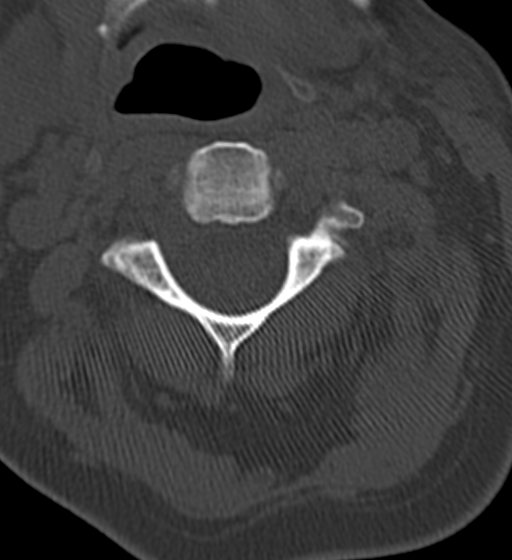
[im 66/80  soft-tissue]
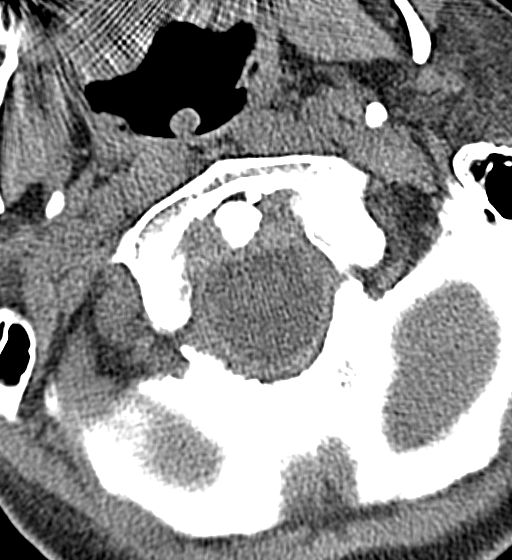
[im 66/80  bone]
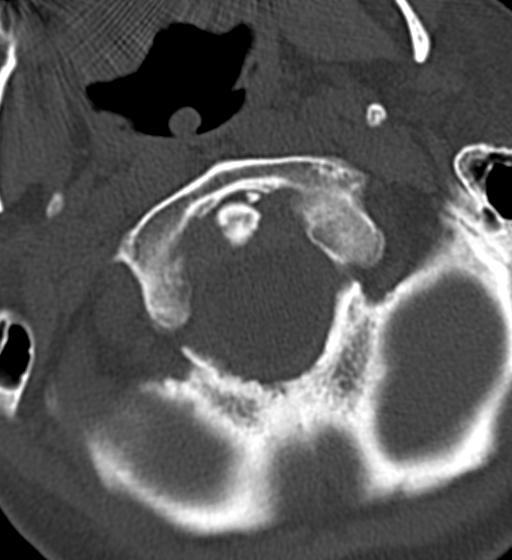

[10 of 14 positions shown; findings below may reference images not displayed]

FINDINGS: No fracture. No acute soft tissue abnormalities. Reversed lordosis
related to significant degenerative disc disease in the central
cervical spine. There is also significant multilevel degenerative
facet change. Minimal C4 on C5 anterior listhesis consistent with
degenerative change. Lung apices clear. There is rotation at the
C1-C2 level. Presumably this is due to patient positioning.
IMPRESSION: No fracture.  Significant degenerative change.

Rotation of C1 on C2.  Correlate clinically.
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
